# Patient Record
Sex: Female | Born: 1960 | Hispanic: Yes | Marital: Married | Smoking: Never smoker
Health system: Southern US, Community
[De-identification: ages and names within clinical notes are randomized; demographics above are authoritative.]

## PROBLEM LIST (undated history)

## (undated) DIAGNOSIS — D649 Anemia, unspecified: Secondary | ICD-10-CM

## (undated) DIAGNOSIS — N289 Disorder of kidney and ureter, unspecified: Secondary | ICD-10-CM

## (undated) DIAGNOSIS — I10 Essential (primary) hypertension: Secondary | ICD-10-CM

## (undated) HISTORY — PX: CHOLECYSTECTOMY: SHX55

---

## 2003-06-28 HISTORY — PX: CHOLECYSTECTOMY: SHX55

## 2006-03-01 ENCOUNTER — Ambulatory Visit: Payer: Self-pay

## 2007-05-23 ENCOUNTER — Ambulatory Visit: Payer: Self-pay | Admitting: Family Medicine

## 2009-06-09 ENCOUNTER — Ambulatory Visit: Payer: Self-pay | Admitting: Family Medicine

## 2013-08-05 ENCOUNTER — Ambulatory Visit: Payer: Self-pay | Admitting: Internal Medicine

## 2013-08-26 ENCOUNTER — Ambulatory Visit: Payer: Self-pay | Admitting: Internal Medicine

## 2014-04-11 ENCOUNTER — Ambulatory Visit: Payer: Self-pay | Admitting: Internal Medicine

## 2014-10-09 IMAGING — MG MM ADDITIONAL VIEWS AT NO CHARGE
1 series · 4 of 4 positions shown · non-contrast
Comparison: [DATE] [DATE], [DATE], [DATE] [DATE], [DATE], [DATE] [DATE], [DATE]

CLINICAL DATA: Callback from screening mammogram for possible mass
left breast

EXAM:
DIGITAL DIAGNOSTIC  LEFT MAMMOGRAM WITH CAD
ULTRASOUND LEFT BREAST

[L ML · left · 4 of 4 slices shown]
[im 1/4]
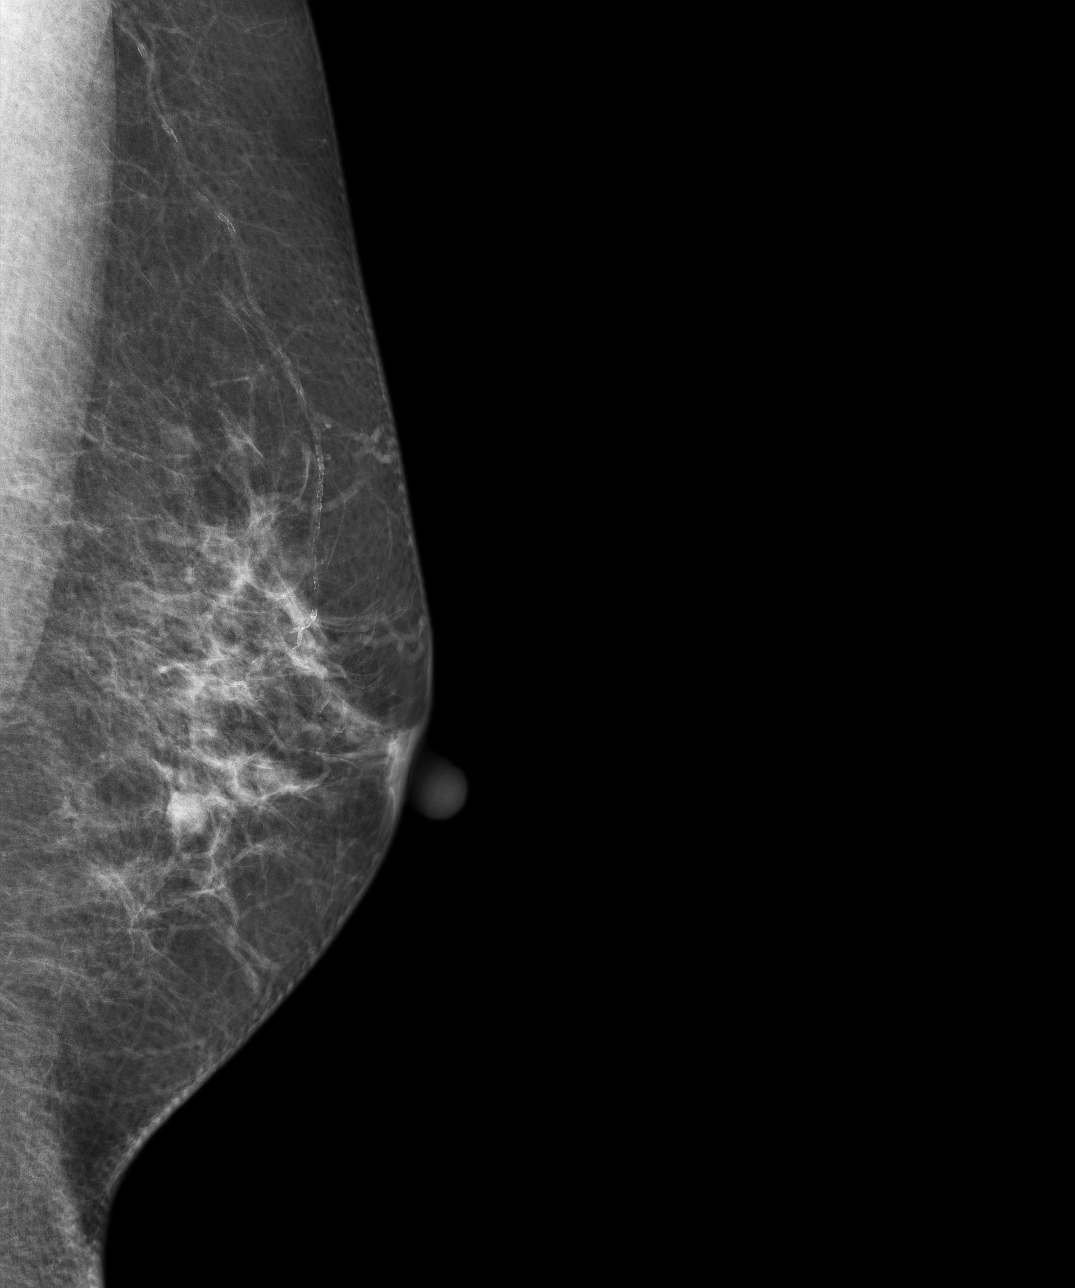
[im 2/4]
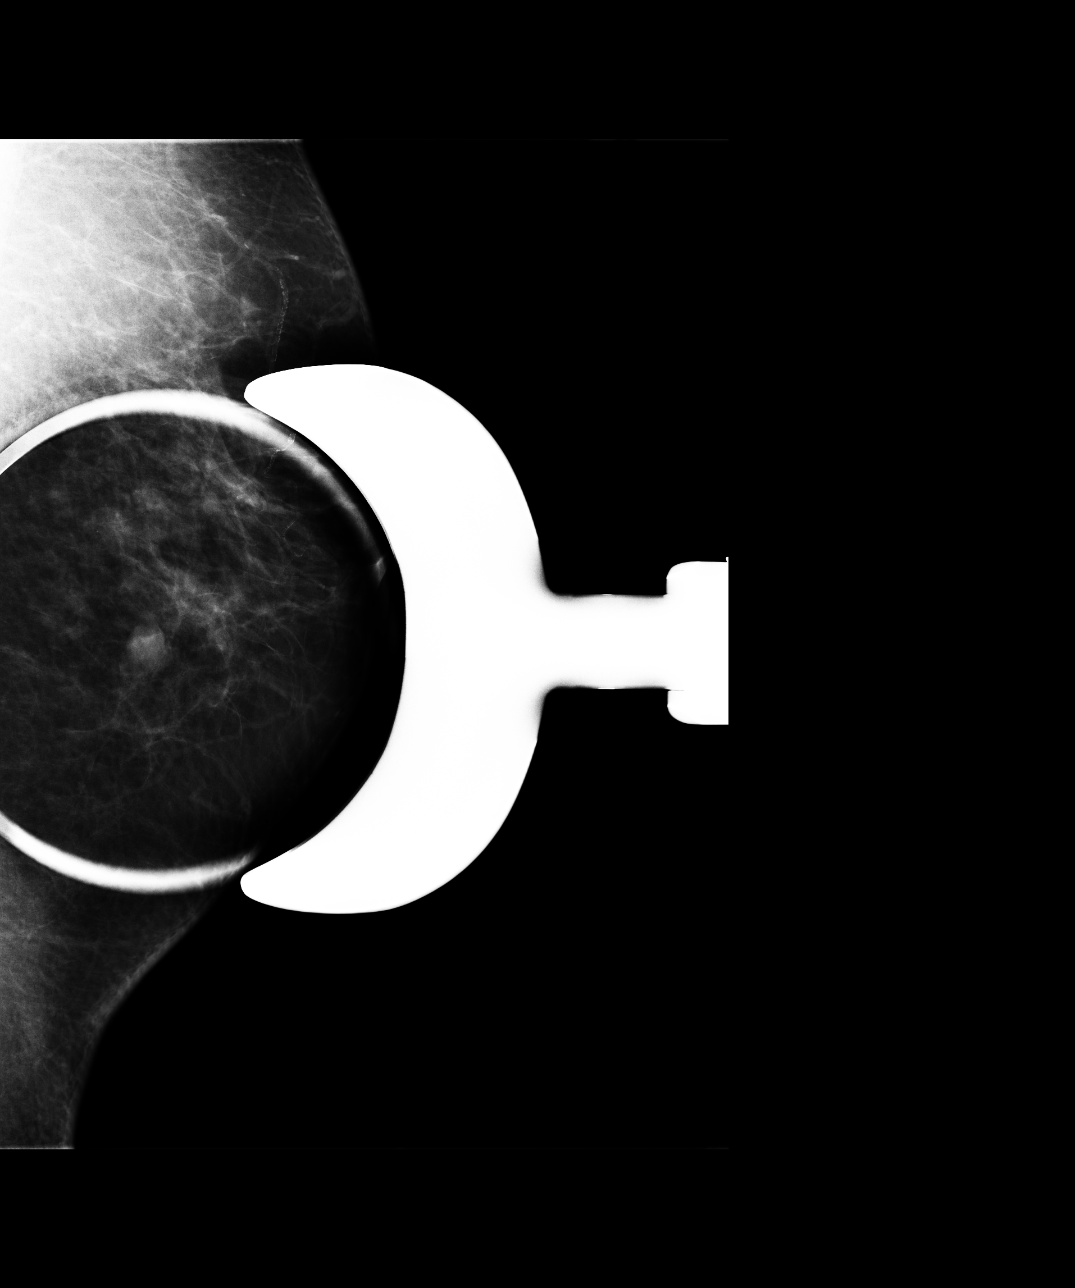
[im 3/4]
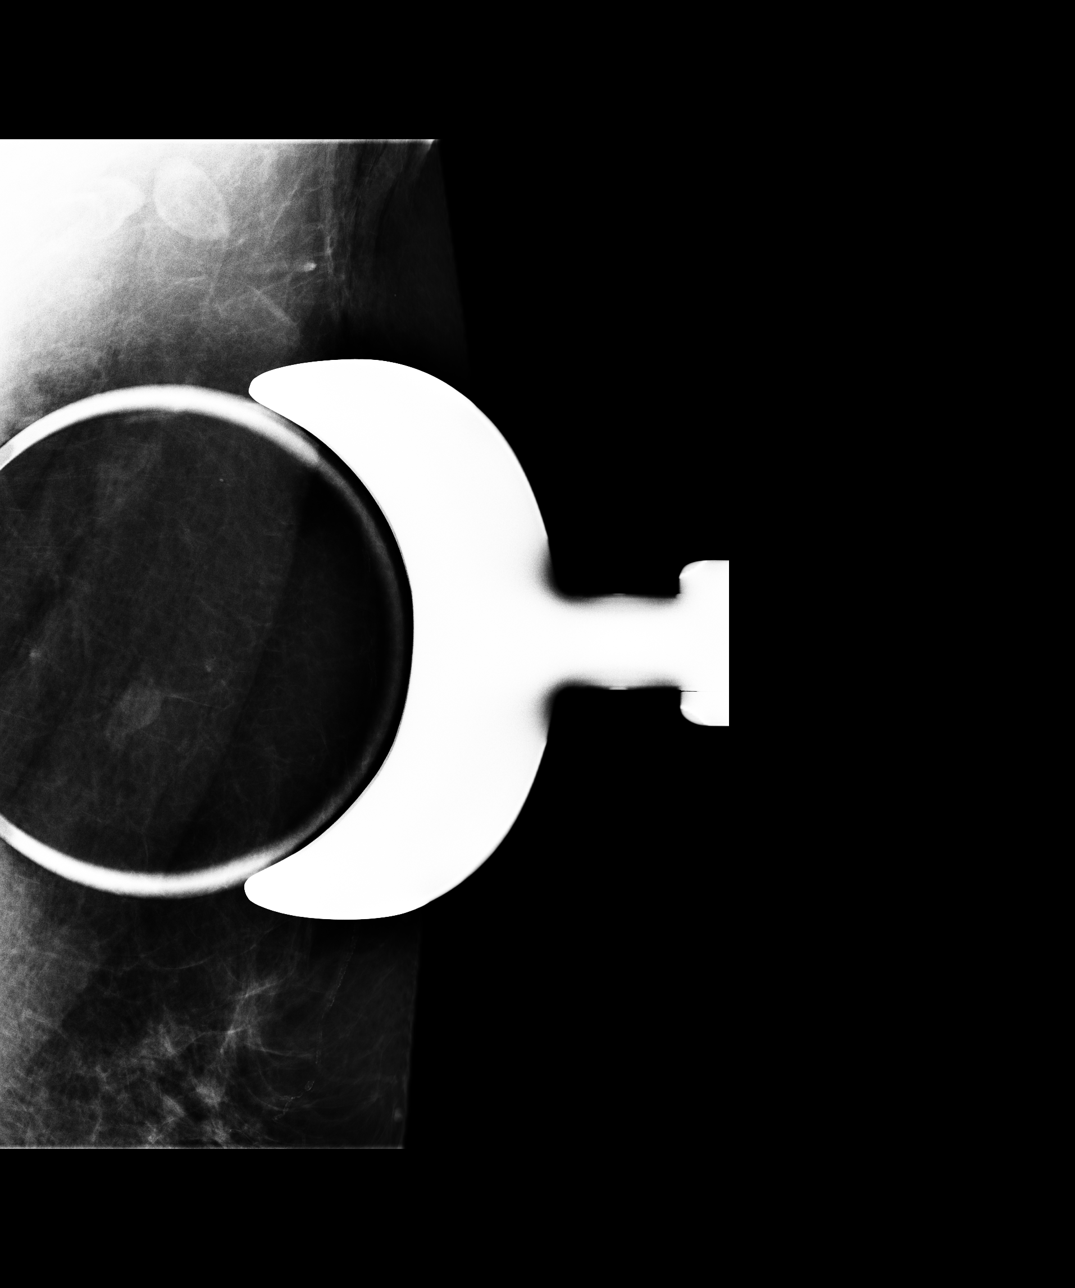
[im 4/4]
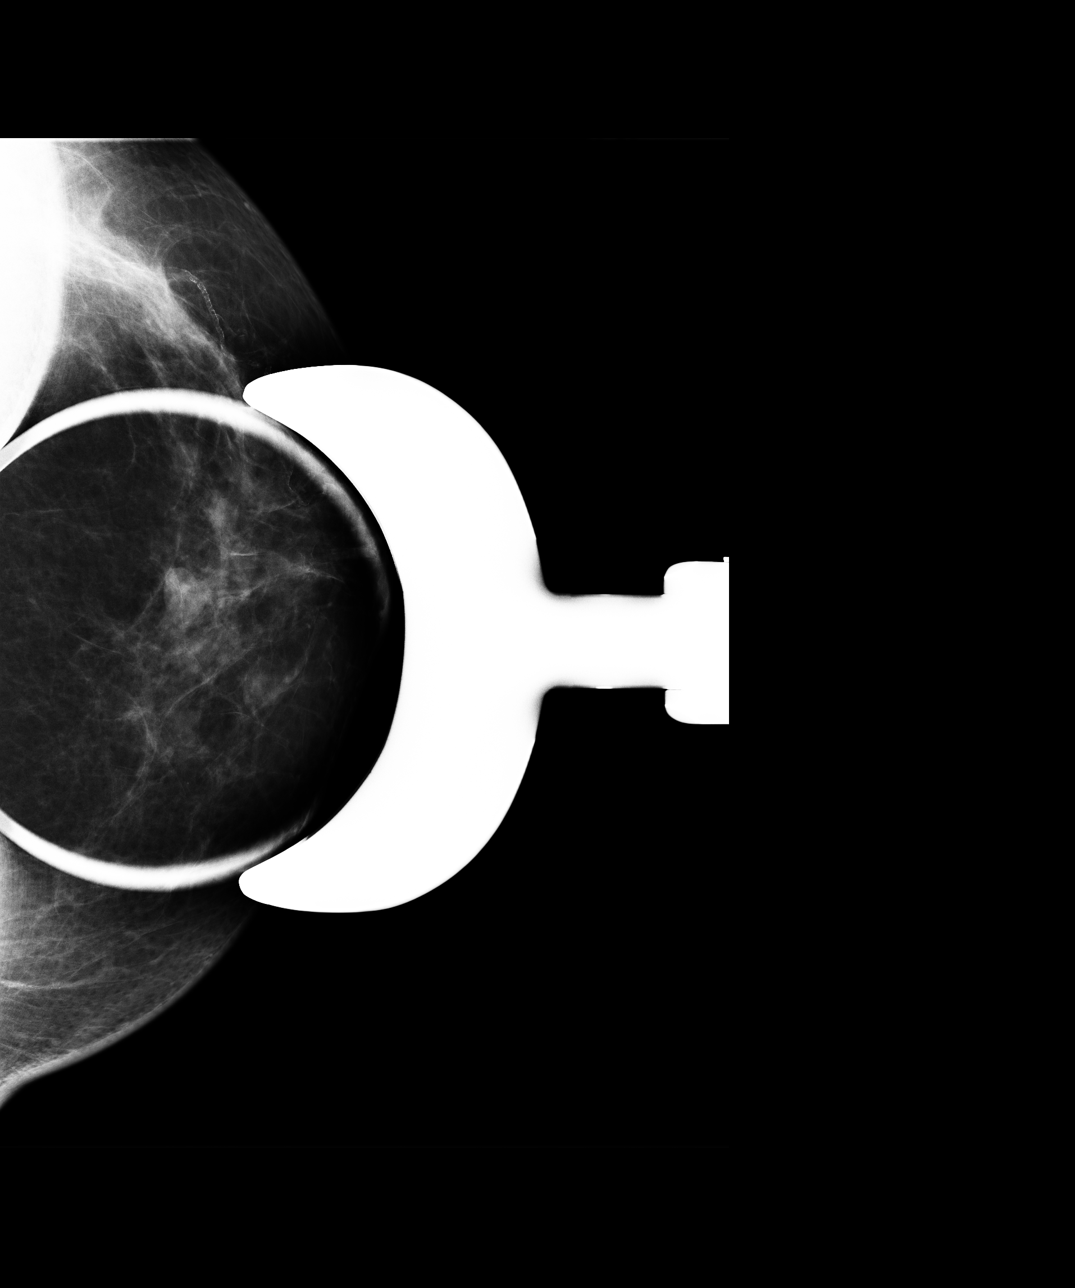

[4 of 4 positions shown; findings below may reference images not displayed]

ACR Breast Density Category b: There are scattered areas of
fibroglandular density.
FINDINGS: Lateral view of left breast, spot compression left CC and MLO views
are submitted. The previously questioned asymmetry in the
lower-inner quadrant left breast persists. The previously questioned
asymmetry in the posterior upper left breast has focal notched fat
and is unchanged from prior exam, consistent with intramammary lymph
node.

Mammographic images were processed with CAD

Ultrasound is performed, showing a 0.62 x 0.36 x 0.77 oval
hypoechoic lesion at the left breast 7:30 o'clock position
subareolar region correlating to the mammographic finding. This
could be a fibroadenoma.
IMPRESSION: Probable benign findings.

RECOMMENDATION:
Six month followup mammogram and ultrasound of left breast.

I have discussed the findings and recommendations with the patient.
Results were also provided in writing at the conclusion of the
visit. If applicable, a reminder letter will be sent to the patient
regarding the next appointment.

BI-RADS CATEGORY  3: Probably benign finding(s) - short interval
follow-up suggested.

## 2014-10-09 IMAGING — US US BREAST*L* LIMITED INC AXILLA
1 series · 4 of 4 positions shown · non-contrast
Comparison: [DATE] [DATE], [DATE], [DATE] [DATE], [DATE], [DATE] [DATE], [DATE]

CLINICAL DATA: Callback from screening mammogram for possible mass
left breast

EXAM:
DIGITAL DIAGNOSTIC  LEFT MAMMOGRAM WITH CAD
ULTRASOUND LEFT BREAST

[Series 1: us breast*left* limited inc axilla · 0.08mm/px · 4 of 4 slices shown]
[im 1/4]
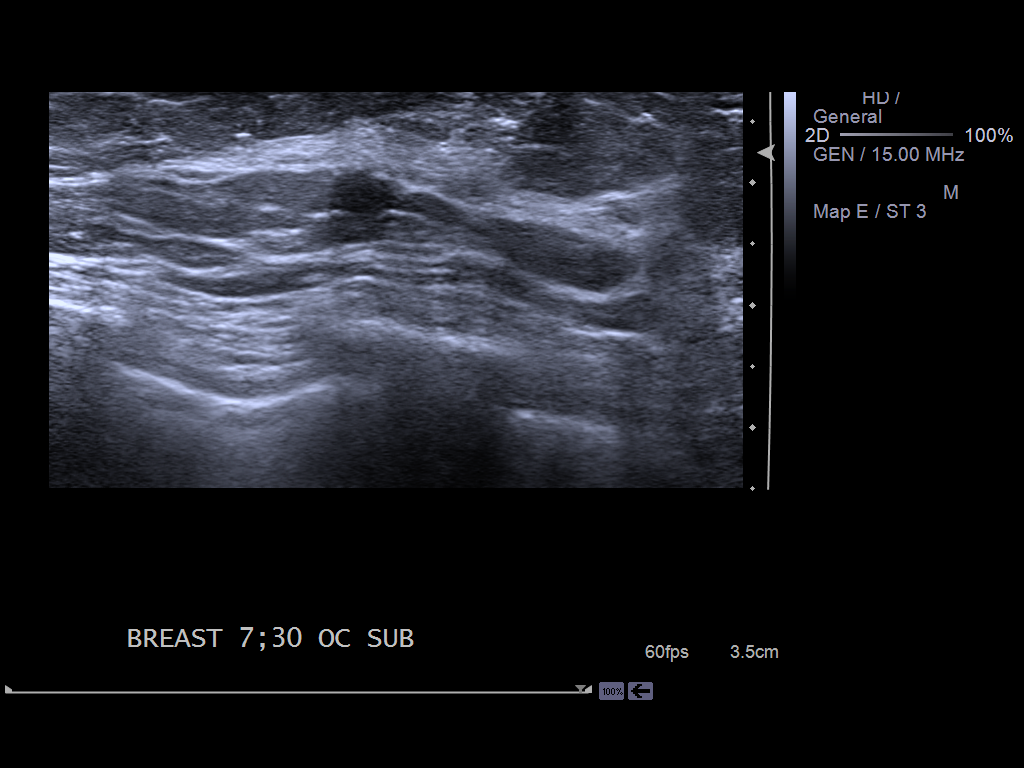
[im 2/4]
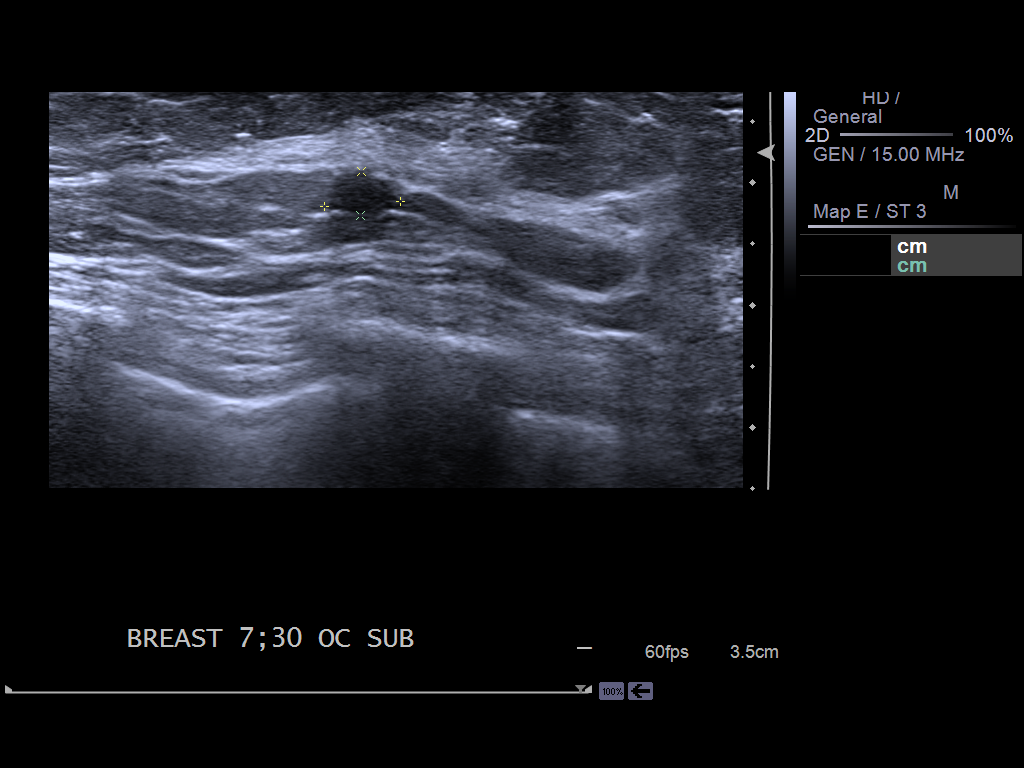
[im 3/4]
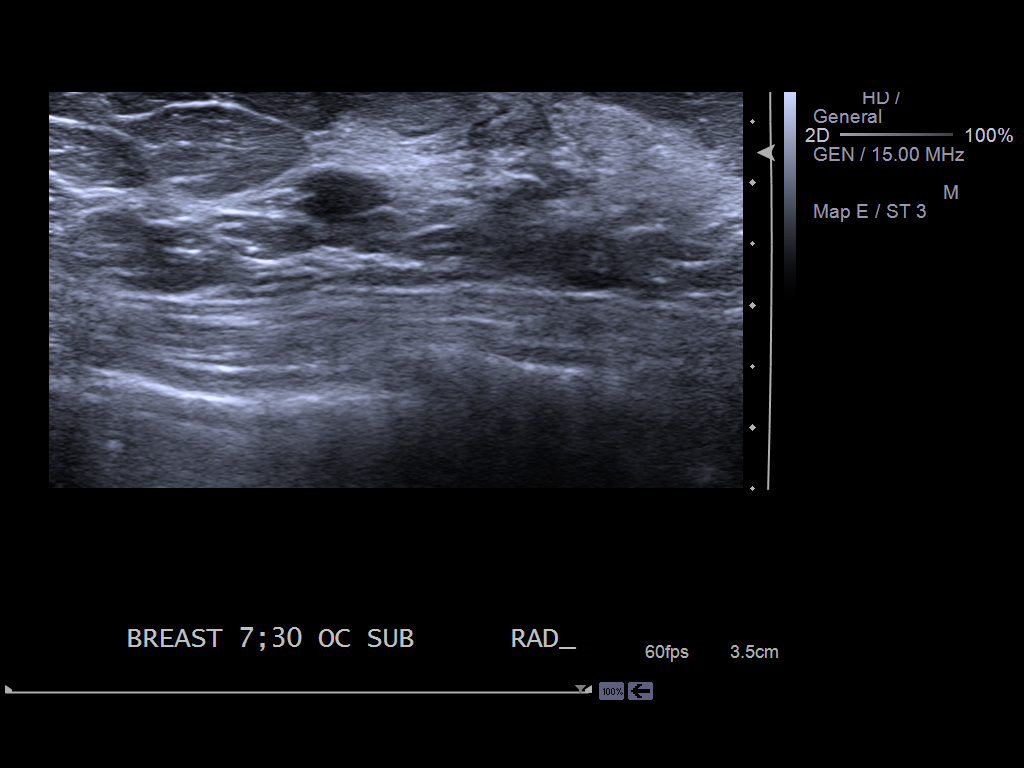
[im 4/4]
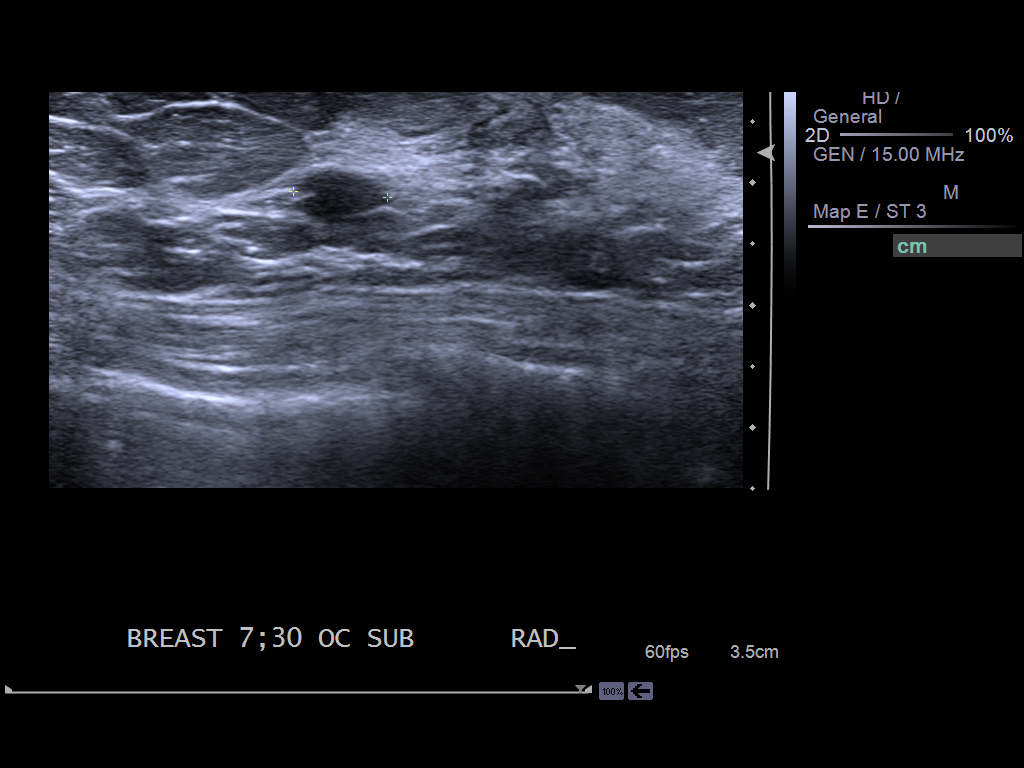

[4 of 4 positions shown; findings below may reference images not displayed]

ACR Breast Density Category b: There are scattered areas of
fibroglandular density.
FINDINGS: Lateral view of left breast, spot compression left CC and MLO views
are submitted. The previously questioned asymmetry in the
lower-inner quadrant left breast persists. The previously questioned
asymmetry in the posterior upper left breast has focal notched fat
and is unchanged from prior exam, consistent with intramammary lymph
node.

Mammographic images were processed with CAD

Ultrasound is performed, showing a 0.62 x 0.36 x 0.77 oval
hypoechoic lesion at the left breast 7:30 o'clock position
subareolar region correlating to the mammographic finding. This
could be a fibroadenoma.
IMPRESSION: Probable benign findings.

RECOMMENDATION:
Six month followup mammogram and ultrasound of left breast.

I have discussed the findings and recommendations with the patient.
Results were also provided in writing at the conclusion of the
visit. If applicable, a reminder letter will be sent to the patient
regarding the next appointment.

BI-RADS CATEGORY  3: Probably benign finding(s) - short interval
follow-up suggested.

## 2015-01-22 ENCOUNTER — Other Ambulatory Visit: Payer: Self-pay | Admitting: Family Medicine

## 2015-01-22 DIAGNOSIS — R1011 Right upper quadrant pain: Secondary | ICD-10-CM

## 2015-01-27 ENCOUNTER — Ambulatory Visit: Admission: RE | Admit: 2015-01-27 | Payer: Self-pay | Source: Ambulatory Visit

## 2015-10-23 ENCOUNTER — Other Ambulatory Visit: Payer: Self-pay | Admitting: Obstetrics and Gynecology

## 2015-10-23 DIAGNOSIS — Z1231 Encounter for screening mammogram for malignant neoplasm of breast: Secondary | ICD-10-CM

## 2015-10-26 ENCOUNTER — Ambulatory Visit: Payer: Self-pay | Attending: Obstetrics and Gynecology

## 2016-10-20 ENCOUNTER — Other Ambulatory Visit: Payer: Self-pay | Admitting: Obstetrics and Gynecology

## 2016-10-20 DIAGNOSIS — Z1231 Encounter for screening mammogram for malignant neoplasm of breast: Secondary | ICD-10-CM

## 2016-11-22 ENCOUNTER — Encounter (HOSPITAL_COMMUNITY): Payer: Self-pay

## 2016-11-22 ENCOUNTER — Ambulatory Visit
Admission: RE | Admit: 2016-11-22 | Discharge: 2016-11-22 | Disposition: A | Discharge status: Home / Self Care | Payer: BLUE CROSS/BLUE SHIELD | Source: Ambulatory Visit | Attending: Obstetrics and Gynecology | Admitting: Obstetrics and Gynecology

## 2016-11-22 DIAGNOSIS — Z1231 Encounter for screening mammogram for malignant neoplasm of breast: Secondary | ICD-10-CM

## 2017-10-24 ENCOUNTER — Other Ambulatory Visit: Payer: Self-pay | Admitting: Obstetrics and Gynecology

## 2017-10-24 DIAGNOSIS — Z1231 Encounter for screening mammogram for malignant neoplasm of breast: Secondary | ICD-10-CM

## 2017-11-24 ENCOUNTER — Ambulatory Visit
Admission: RE | Admit: 2017-11-24 | Discharge: 2017-11-24 | Disposition: A | Discharge status: Home / Self Care | Payer: BLUE CROSS/BLUE SHIELD | Source: Ambulatory Visit | Attending: Obstetrics and Gynecology | Admitting: Obstetrics and Gynecology

## 2017-11-24 DIAGNOSIS — Z1231 Encounter for screening mammogram for malignant neoplasm of breast: Secondary | ICD-10-CM

## 2018-03-24 ENCOUNTER — Other Ambulatory Visit: Payer: Self-pay

## 2018-03-24 ENCOUNTER — Encounter: Payer: Self-pay | Admitting: Emergency Medicine

## 2018-03-24 ENCOUNTER — Emergency Department
Admission: EM | Admit: 2018-03-24 | Discharge: 2018-03-24 | Disposition: A | Discharge status: Home / Self Care | Payer: BLUE CROSS/BLUE SHIELD | Source: Home / Self Care | Attending: Emergency Medicine | Admitting: Emergency Medicine

## 2018-03-24 DIAGNOSIS — R112 Nausea with vomiting, unspecified: Secondary | ICD-10-CM | POA: Insufficient documentation

## 2018-03-24 DIAGNOSIS — Z79899 Other long term (current) drug therapy: Secondary | ICD-10-CM

## 2018-03-24 DIAGNOSIS — K56609 Unspecified intestinal obstruction, unspecified as to partial versus complete obstruction: Secondary | ICD-10-CM | POA: Diagnosis not present

## 2018-03-24 DIAGNOSIS — R1012 Left upper quadrant pain: Secondary | ICD-10-CM

## 2018-03-24 DIAGNOSIS — R109 Unspecified abdominal pain: Secondary | ICD-10-CM

## 2018-03-24 DIAGNOSIS — D649 Anemia, unspecified: Secondary | ICD-10-CM | POA: Diagnosis present

## 2018-03-24 LAB — CBC
HEMATOCRIT: 29 % — AB (ref 35.0–47.0)
HEMATOCRIT: 32.2 % — AB (ref 35.0–47.0)
HEMOGLOBIN: 9.9 g/dL — AB (ref 12.0–16.0)
HEMOGLOBIN: 10.4 g/dL — AB (ref 12.0–16.0)
MCH: 24.9 pg — ABNORMAL LOW (ref 26.0–34.0)
MCH: 23.3 pg — ABNORMAL LOW (ref 26.0–34.0)
MCHC: 34.1 g/dL (ref 32.0–36.0)
MCHC: 32.2 g/dL (ref 32.0–36.0)
MCV: 73.1 fL — AB (ref 80.0–100.0)
MCV: 72.3 fL — ABNORMAL LOW (ref 80.0–100.0)
Platelets: 372 10*3/uL (ref 150–440)
Platelets: 429 10*3/uL (ref 150–440)
RBC: 3.96 MIL/uL (ref 3.80–5.20)
RBC: 4.45 MIL/uL (ref 3.80–5.20)
RDW: 18.5 % — ABNORMAL HIGH (ref 11.5–14.5)
RDW: 18.8 % — ABNORMAL HIGH (ref 11.5–14.5)
WBC: 8.9 10*3/uL (ref 3.6–11.0)
WBC: 14.8 10*3/uL — AB (ref 3.6–11.0)

## 2018-03-24 LAB — COMPREHENSIVE METABOLIC PANEL
ALBUMIN: 4.5 g/dL (ref 3.5–5.0)
ALT: 15 U/L (ref 0–44)
ALT: 18 U/L (ref 0–44)
ANION GAP: 10 (ref 5–15)
ANION GAP: 10 (ref 5–15)
AST: 26 U/L (ref 15–41)
AST: 33 U/L (ref 15–41)
Albumin: 4.8 g/dL (ref 3.5–5.0)
Alkaline Phosphatase: 91 U/L (ref 38–126)
Alkaline Phosphatase: 104 U/L (ref 38–126)
BILIRUBIN TOTAL: 0.7 mg/dL (ref 0.3–1.2)
BILIRUBIN TOTAL: 0.8 mg/dL (ref 0.3–1.2)
BUN: 15 mg/dL (ref 6–20)
BUN: 22 mg/dL — ABNORMAL HIGH (ref 6–20)
CHLORIDE: 103 mmol/L (ref 98–111)
CO2: 25 mmol/L (ref 22–32)
CO2: 26 mmol/L (ref 22–32)
Calcium: 9.6 mg/dL (ref 8.9–10.3)
Calcium: 9.8 mg/dL (ref 8.9–10.3)
Chloride: 101 mmol/L (ref 98–111)
Creatinine, Ser: 0.8 mg/dL (ref 0.44–1.00)
Creatinine, Ser: 0.9 mg/dL (ref 0.44–1.00)
GFR calc Af Amer: >60 mL/min (ref >60)
GFR calc non Af Amer: >60 mL/min (ref >60)
GLUCOSE: 127 mg/dL — AB (ref 70–99)
Glucose, Bld: 160 mg/dL — ABNORMAL HIGH (ref 70–99)
POTASSIUM: 4.2 mmol/L (ref 3.5–5.1)
POTASSIUM: 4.1 mmol/L (ref 3.5–5.1)
SODIUM: 138 mmol/L (ref 135–145)
Sodium: 137 mmol/L (ref 135–145)
Total Protein: 8 g/dL (ref 6.5–8.1)
Total Protein: 8.1 g/dL (ref 6.5–8.1)

## 2018-03-24 LAB — URINALYSIS, COMPLETE (UACMP) WITH MICROSCOPIC
BACTERIA UA: NONE SEEN
BILIRUBIN URINE: NEGATIVE
Glucose, UA: NEGATIVE mg/dL
HGB URINE DIPSTICK: NEGATIVE
Ketones, ur: NEGATIVE mg/dL
LEUKOCYTES UA: NEGATIVE
Nitrite: NEGATIVE
PROTEIN: 30 mg/dL — AB
Specific Gravity, Urine: 1.023 (ref 1.005–1.030)
Squamous Epithelial / LPF: NONE SEEN (ref 0–5)
pH: 8 (ref 5.0–8.0)

## 2018-03-24 LAB — LIPASE, BLOOD
LIPASE: 38 U/L (ref 11–51)
Lipase: 40 U/L (ref 11–51)

## 2018-03-24 LAB — TROPONIN I: Troponin I: <0.03 ng/mL (ref <0.03)

## 2018-03-24 MED ORDER — DICYCLOMINE HCL 20 MG PO TABS: 20.0000 mg | ORAL_TABLET | Freq: Three times a day (TID) | ORAL | 0 refills | Status: DC | PRN

## 2018-03-24 MED ORDER — DICYCLOMINE HCL 10 MG PO CAPS
20.0000 mg | ORAL_CAPSULE | Freq: Once | ORAL | Status: AC
  Administered 2018-03-24: 20 mg via ORAL
  Filled 2018-03-24: qty 2

## 2018-03-24 MED ORDER — GI COCKTAIL ~~LOC~~
30.0000 mL | Freq: Once | ORAL | Status: AC
  Administered 2018-03-24: 30 mL via ORAL
  Filled 2018-03-24: qty 30

## 2018-03-24 MED ORDER — SUCRALFATE 1 G PO TABS: 1.0000 g | ORAL_TABLET | Freq: Four times a day (QID) | ORAL | 0 refills | Status: DC

## 2018-03-24 MED ORDER — ONDANSETRON HCL 4 MG PO TABS: 4.0000 mg | ORAL_TABLET | Freq: Three times a day (TID) | ORAL | 0 refills | Status: DC | PRN

## 2018-03-24 NOTE — ED Notes (Signed)
Interpreter requested 

## 2018-03-24 NOTE — ED Notes (Signed)
Patient drank full cup of water. No c/o nausea at this time.

## 2018-03-24 NOTE — ED Provider Notes (Signed)
[facility] Emergency Department Provider Note   ____________________________________________   I have reviewed the triage vital signs and the nursing notes.   HISTORY  Chief Complaint Abdominal Pain   History limited by: [place] utilized   HPI Nicole Walsh is a 57 y.o. female who presents to the emergency department today because of concerns for abdominal pain.  The pain started last night.  Located in the left abdomen left upper quadrant.  The pain does radiate around to her back.  She has had some nausea and vomiting with this.  Vomiting was nonbloody.  Pain has been constant since it started.  She states that it reminds her of similar pain she had one year ago.  She states she went to [facility] at that time.  She denies any fevers.    Per medical record review patient has a history of ER visit to [facility] a little over one year ago. CT scan done at that time concerning for infectious enteritis.   History reviewed. No pertinent past medical history.  There are no active problems to display for this patient.   History reviewed. No pertinent surgical history.  Prior to Admission medications   Not on File    Allergies Patient has no allergy information on record.  Family History  Problem Relation Age of Onset  . Breast cancer Neg Hx     Social History Social History   Tobacco Use  . Smoking status: Not on file  Substance Use Topics  . Alcohol use: Not on file  . Drug use: Not on file    Review of Systems Constitutional: No fever/chills Eyes: No visual changes. ENT: No sore throat. Cardiovascular: Denies chest pain. Respiratory: Denies shortness of breath. Gastrointestinal: Positive for abdominal pain, nausea and vomiting.   Genitourinary: Negative for dysuria. Musculoskeletal: Negative for back pain. Skin: Negative for rash. Neurological: Negative for headaches, focal weakness or  numbness.  ____________________________________________   PHYSICAL EXAM:  VITAL SIGNS: ED Triage Vitals  Enc Vitals Group     BP 03/24/18 1019 133/77     Pulse Rate 03/24/18 1019 81     Resp 03/24/18 1019 18     Temp 03/24/18 1019 98.2 F (36.8 C)     Temp Source 03/24/18 1019 Oral     SpO2 03/24/18 1019 100 %     Weight 03/24/18 1021 160 lb (72.6 kg)     Height 03/24/18 1021 5\' 5"  (1.651 m)     Head Circumference --      Peak Flow --      Pain Score 03/24/18 1029 9    Constitutional: Alert and oriented.  Eyes: Conjunctivae are normal.  ENT      Head: Normocephalic and atraumatic.      Nose: No congestion/rhinnorhea.      Mouth/Throat: Mucous membranes are moist.      Neck: No stridor. Hematological/Lymphatic/Immunilogical: No cervical lymphadenopathy. Cardiovascular: Normal rate, regular rhythm.  No murmurs, rubs, or gallops.  Respiratory: Normal respiratory effort without tachypnea nor retractions. Breath sounds are clear and equal bilaterally. No wheezes/rales/rhonchi. Gastrointestinal: Soft and tender to palpation in the left abd and LUQ. Genitourinary: Deferred Musculoskeletal: Normal range of motion in all extremities. No lower extremity edema. Neurologic:  Normal speech and language. No gross focal neurologic deficits are appreciated.  Skin:  Skin is warm, dry and intact. No rash noted. Psychiatric: Mood and affect are normal. Speech and behavior are normal. Patient exhibits appropriate insight and  judgment.  ____________________________________________    LABS (pertinent positives/negatives)  Lipase 38 Trop <0.03 CBC wbc 8.9, hgb 9.9, plt 372 CMP wnl except glu 127  ____________________________________________   EKG  I, Nance Pear, attending physician, personally viewed and interpreted this EKG  EKG Time: 1035 Rate: 87 Rhythm: normal sinus rhythm Axis: normal Intervals: qtc 433 QRS: narrow ST changes: no st elevation Impression: normal  ekg   ____________________________________________    RADIOLOGY  None  ____________________________________________   PROCEDURES  Procedures  ____________________________________________   INITIAL IMPRESSION / ASSESSMENT AND PLAN / ED COURSE  Pertinent labs & imaging results that were available during my care of the patient were reviewed by me and considered in my medical decision making (see chart for details).   Patient presented to the emergency department today because of concerns for abdominal pain located on the left side.  Differential includes gastritis, gastric ulcer, diverticulitis, gastroenteritis amongst other etiologies.  Patient states his pain is similar to pain she had one year ago.  CT scan at that time showed possible infective enteritis.  Patient without any leukocytosis or fever here.  She did have good relief of pain with Bentyl.  At this point do not feel any emergent reimaging is necessary.  Will plan on discharge with medication.  ____________________________________________   FINAL CLINICAL IMPRESSION(S) / ED DIAGNOSES  Final diagnoses:  Abdominal pain, unspecified abdominal location     Note: This dictation was prepared with Dragon dictation. Any transcriptional errors that result from this process are unintentional     Nance Pear, MD 03/24/18 1401

## 2018-03-24 NOTE — Discharge Instructions (Addendum)
Please seek medical attention for any high fevers, chest pain, shortness of breath, change in behavior, persistent vomiting, bloody stool or any other new or concerning symptoms.  

## 2018-03-24 NOTE — ED Triage Notes (Signed)
Pt to triage via wheelchair. Pt reports pain to her abd from midline radiating around to her left flank region. Pt was seen here earlier today and given medications for the pain but states they are not helping. Pt asking for an xray of her abd. Pt denies urinary sx or diarrhea but reports + n/v.

## 2018-03-24 NOTE — ED Triage Notes (Signed)
Pt reports pain to the left side of her abdomen that radiates to her back. PT states he pain is intermittent and started a year ago. Pt reports the pain was worse today.

## 2018-03-24 NOTE — ED Notes (Signed)
Spoke with Dr Karma Greaser about pt and only wants to repeat blood work.

## 2018-03-25 ENCOUNTER — Encounter: Payer: Self-pay | Admitting: Radiology

## 2018-03-25 ENCOUNTER — Other Ambulatory Visit: Payer: Self-pay

## 2018-03-25 ENCOUNTER — Emergency Department: Payer: BLUE CROSS/BLUE SHIELD

## 2018-03-25 ENCOUNTER — Inpatient Hospital Stay
Admission: EM | Admit: 2018-03-25 | Discharge: 2018-03-28 | DRG: 390 | Disposition: A | Discharge status: Home / Self Care | Payer: BLUE CROSS/BLUE SHIELD | Attending: Surgery | Admitting: Surgery

## 2018-03-25 ENCOUNTER — Inpatient Hospital Stay: Payer: BLUE CROSS/BLUE SHIELD

## 2018-03-25 DIAGNOSIS — K56609 Unspecified intestinal obstruction, unspecified as to partial versus complete obstruction: Secondary | ICD-10-CM | POA: Diagnosis present

## 2018-03-25 DIAGNOSIS — Z79899 Other long term (current) drug therapy: Secondary | ICD-10-CM | POA: Diagnosis not present

## 2018-03-25 DIAGNOSIS — D649 Anemia, unspecified: Secondary | ICD-10-CM | POA: Diagnosis present

## 2018-03-25 DIAGNOSIS — Z978 Presence of other specified devices: Secondary | ICD-10-CM

## 2018-03-25 MED ORDER — FAMOTIDINE IN NACL 20-0.9 MG/50ML-% IV SOLN
20.0000 mg | Freq: Two times a day (BID) | INTRAVENOUS | Status: DC
  Administered 2018-03-25 – 2018-03-28 (×8): 20 mg via INTRAVENOUS
  Filled 2018-03-25 (×8): qty 50

## 2018-03-25 MED ORDER — ONDANSETRON HCL 4 MG/2ML IJ SOLN
4.0000 mg | Freq: Once | INTRAMUSCULAR | Status: AC
  Administered 2018-03-25: 4 mg via INTRAVENOUS
  Filled 2018-03-25: qty 2

## 2018-03-25 MED ORDER — ACETAMINOPHEN 325 MG PO TABS
650.0000 mg | ORAL_TABLET | Freq: Four times a day (QID) | ORAL | Status: DC | PRN
  Administered 2018-03-25: 650 mg via ORAL
  Filled 2018-03-25: qty 2

## 2018-03-25 MED ORDER — ONDANSETRON 4 MG PO TBDP: 4.0000 mg | ORAL_TABLET | Freq: Four times a day (QID) | ORAL | Status: DC | PRN

## 2018-03-25 MED ORDER — GI COCKTAIL ~~LOC~~
30.0000 mL | Freq: Once | ORAL | Status: AC
  Administered 2018-03-25: 30 mL via ORAL
  Filled 2018-03-25: qty 30

## 2018-03-25 MED ORDER — PHENOL 1.4 % MT LIQD
1.0000 | OROMUCOSAL | Status: DC | PRN
  Administered 2018-03-25 – 2018-03-26 (×2): 1 via OROMUCOSAL
  Filled 2018-03-25: qty 177

## 2018-03-25 MED ORDER — SODIUM CHLORIDE 0.9 % IV BOLUS
1000.0000 mL | Freq: Once | INTRAVENOUS | Status: AC
  Administered 2018-03-25: 1000 mL via INTRAVENOUS

## 2018-03-25 MED ORDER — ENOXAPARIN SODIUM 40 MG/0.4ML ~~LOC~~ SOLN
40.0000 mg | SUBCUTANEOUS | Status: DC
  Administered 2018-03-25 – 2018-03-28 (×4): 40 mg via SUBCUTANEOUS
  Filled 2018-03-25 (×4): qty 0.4

## 2018-03-25 MED ORDER — IOPAMIDOL (ISOVUE-300) INJECTION 61%
30.0000 mL | Freq: Once | INTRAVENOUS | Status: AC | PRN
  Administered 2018-03-25: 30 mL via ORAL

## 2018-03-25 MED ORDER — LACTATED RINGERS IV SOLN
INTRAVENOUS | Status: DC
  Administered 2018-03-25 – 2018-03-26 (×2): via INTRAVENOUS

## 2018-03-25 MED ORDER — TRAMADOL HCL 50 MG PO TABS: 50.0000 mg | ORAL_TABLET | Freq: Four times a day (QID) | ORAL | Status: DC | PRN

## 2018-03-25 MED ORDER — IOPAMIDOL (ISOVUE-300) INJECTION 61%
100.0000 mL | Freq: Once | INTRAVENOUS | Status: AC | PRN
  Administered 2018-03-25: 100 mL via INTRAVENOUS

## 2018-03-25 MED ORDER — IBUPROFEN 100 MG/5ML PO SUSP: 400.0000 mg | Freq: Once | ORAL | Status: DC

## 2018-03-25 MED ORDER — MORPHINE SULFATE (PF) 2 MG/ML IV SOLN
2.0000 mg | INTRAVENOUS | Status: DC | PRN
  Administered 2018-03-25: 2 mg via INTRAVENOUS
  Filled 2018-03-25: qty 1

## 2018-03-25 MED ORDER — DOCUSATE SODIUM 100 MG PO CAPS: 100.0000 mg | ORAL_CAPSULE | Freq: Two times a day (BID) | ORAL | Status: DC | PRN

## 2018-03-25 MED ORDER — ONDANSETRON HCL 4 MG/2ML IJ SOLN: 4.0000 mg | Freq: Four times a day (QID) | INTRAMUSCULAR | Status: DC | PRN

## 2018-03-25 MED ORDER — MORPHINE SULFATE (PF) 4 MG/ML IV SOLN
4.0000 mg | Freq: Once | INTRAVENOUS | Status: AC
  Administered 2018-03-25: 4 mg via INTRAVENOUS
  Filled 2018-03-25: qty 1

## 2018-03-25 NOTE — H&P (Signed)
Subjective:   CC: obstruction  HPI:  Nicole Walsh is a 57 y.o. female who was consulted by [facility] for issue above.  Symptoms were first noted 2 days ago. Pain is LLQ, sharp, confined to the area, without radiation.  Associated with nausea/ vomting, one episode of diarrhea prior to admission.   exacerbated by nothing specific     Past Medical History: none reported  Past Surgical History: lap chole  Family History: non-contributory  Social History: denies tobacco, ETOH  Current Medications: no blood thinners  Allergies:  Allergies as of 03/24/2018  . (No Known Allergies)    ROS:  A 15 point review of systems was performed and pertinent positives and negatives noted in HPI   Objective:     BP 124/76 (BP Location: Left Arm)   Pulse (!) 110   Temp 98.8 F (37.1 C) (Oral)   Resp 18   Ht 5\' 5"  (1.651 m)   Wt 72.6 kg   SpO2 98%   BMI 26.63 kg/m   Constitutional :  alert, cooperative, appears stated age and no distress  Lymphatics/Throat:  no asymmetry, masses, or scars  Respiratory:  clear to auscultation bilaterally  Cardiovascular:  regular rate and rhythm  Gastrointestinal: minimally tender LLQ, suprapubic, soft, no guarding.   Musculoskeletal: Steady gait and movement  Skin: Cool and moist, lap chole surgical scars   Psychiatric: Normal affect, non-agitated, not confused       LABS:  CMP Latest Ref Rng & Units 03/24/2018 03/24/2018  Glucose 70 - 99 mg/dL 160(H) 127(H)  BUN 6 - 20 mg/dL 22(H) 15  Creatinine 0.44 - 1.00 mg/dL 0.90 0.80  Sodium 135 - 145 mmol/L 137 138  Potassium 3.5 - 5.1 mmol/L 4.1 4.2  Chloride 98 - 111 mmol/L 101 103  CO2 22 - 32 mmol/L 26 25  Calcium 8.9 - 10.3 mg/dL 9.8 9.6  Total Protein 6.5 - 8.1 g/dL 8.1 8.0  Total Bilirubin 0.3 - 1.2 mg/dL 0.8 0.7  Alkaline Phos 38 - 126 U/L 104 91  AST 15 - 41 U/L 33 26  ALT 0 - 44 U/L 18 15   CBC Latest Ref Rng & Units 03/24/2018 03/24/2018  WBC 3.6 - 11.0 K/uL 14.8(H) 8.9  Hemoglobin 12.0 -  16.0 g/dL 10.4(L) 9.9(L)  Hematocrit 35.0 - 47.0 % 32.2(L) 29.0(L)  Platelets 150 - 440 K/uL 429 372    RADS: CLINICAL DATA:  57 year old female with left-sided abdominal pain.  EXAM: CT ABDOMEN AND PELVIS WITH CONTRAST  TECHNIQUE: Multidetector CT imaging of the abdomen and pelvis was performed using the standard protocol following bolus administration of intravenous contrast.  CONTRAST:  160mL ISOVUE-300 IOPAMIDOL (ISOVUE-300) INJECTION 61%, 32mL ISOVUE-300 IOPAMIDOL (ISOVUE-300) INJECTION 61%  COMPARISON:  None.  FINDINGS: Lower chest: The visualized lung bases are clear.  No intra-abdominal free air.  Small free fluid within the pelvis.  Hepatobiliary: Subcentimeter right hepatic hypodense lesion is too small to characterize. The liver is otherwise unremarkable. Mild intrahepatic biliary ductal dilatation, likely post cholecystectomy.  Pancreas: Unremarkable. No pancreatic ductal dilatation or surrounding inflammatory changes.  Spleen: Normal in size without focal abnormality.  Adrenals/Urinary Tract: Adrenal glands are unremarkable. Kidneys are normal, without renal calculi, focal lesion, or hydronephrosis. Bladder is unremarkable.  Stomach/Bowel: Oral contrast opacifies the stomach and multiple loops of small bowel. There is dilatation of loops of small bowel measuring up to 3.5 cm. The terminal ileum is collapsed. The transition is noted in the right anterior hemipelvis (series 2, image  70 and coronal series 5, image 33). There scattered colonic diverticula without active inflammatory changes. The appendix is normal.  Vascular/Lymphatic: No significant vascular findings are present. No enlarged abdominal or pelvic lymph nodes.  Reproductive: The uterus is anteverted and grossly unremarkable. No adnexal masses.  Other: None  Musculoskeletal: Osteopenia. Disc desiccation at L5-S1. No acute osseous pathology.  IMPRESSION: 1. Small-bowel  obstruction with transition in the right anterior pelvis. 2. Colonic diverticulosis.   Electronically Signed   By: Anner Crete M.D.   On: 03/25/2018 02:01 Assessment:   SBO  Plan:     Discussed pathophisiology and treatment options including NG tube decompression and possible surgery for lysis of adhesions, laparoscopic or open.  The risk of surgery include, but not limited to, recurrence, bleeding, chronic pain, post-op infxn, post-op SBO or ileus, hernias, resection of bowel, re-anastamosis, possible ostomy placement and need for re-operation to address said risks. The risks of general anesthetic, if used, includes MI, CVA, sudden death or even reaction to anesthetic medications also discussed. Alternatives include continued observation and NG decompression.  Benefits include possible symptom relief, preventing further decline in health and possible death.  Typical post-op recovery time of additional days in hospital for observation afterwards also discussed.  The patient verbalized understanding and all questions were answered to the patient's satisfaction.  Will place NG and admit to floor.

## 2018-03-25 NOTE — Progress Notes (Addendum)
Pt reconnected to intermittent wall suction via NG tube. Pt in no distress, bed in semi-fowlers at 35 degrees. VS WDL, denies pain, family at bedside.

## 2018-03-25 NOTE — Progress Notes (Signed)
IS education complete (translated by family member). Pt understands technique and reason for use. Pt inspire 2081ml+. Pt independent with use.

## 2018-03-25 NOTE — ED Provider Notes (Signed)
[facility] Emergency Department Provider Note  Time seen: 12:25 AM  I have reviewed the triage vital signs and the nursing notes. Spanish interpreter used for this evaluation via iPad  HISTORY  Chief Complaint Abdominal Pain    HPI Nicole Walsh is a 57 y.o. female with no significant past medical history who presents to the emergency department for left-sided abdominal pain.  According to the patient since yesterday night she has been expensing left-sided abdominal pain described as moderate burning type pain radiating from her left upper abdomen to her left mid back.  Denies any dysuria or hematuria.  Denies diarrhea black or bloody stool.  Patient was seen in the emergency department yesterday for the same pain had a negative work-up and was discharged home.  She states since going home the pain is worsened and she has begun vomiting so she returned to the emergency department.  Denies any fever. History reviewed. No pertinent past medical history.  There are no active problems to display for this patient.   History reviewed. No pertinent surgical history.  Prior to Admission medications   Medication Sig Start Date End Date Taking? Authorizing Provider  dicyclomine (BENTYL) 20 MG tablet Take 1 tablet (20 mg total) by mouth 3 (three) times daily as needed (abdominal pain). 03/24/18   Nance Pear, MD  ondansetron (ZOFRAN) 4 MG tablet Take 1 tablet (4 mg total) by mouth every 8 (eight) hours as needed for nausea or vomiting. 03/24/18   Nance Pear, MD  sucralfate (CARAFATE) 1 g tablet Take 1 tablet (1 g total) by mouth 4 (four) times daily. 03/24/18   Nance Pear, MD    No Known Allergies  Family History  Problem Relation Age of Onset  . Breast cancer Neg Hx     Social History Social History   Tobacco Use  . Smoking status: Not on file  Substance Use Topics  . Alcohol use: Not on file  . Drug use: Not on file    Review of  Systems Constitutional: Negative for fever Cardiovascular: Negative for chest pain. Respiratory: Negative for shortness of breath. Gastrointestinal: Left-sided abdominal pain/burning.  Positive for nausea vomiting.  Negative for diarrhea Genitourinary: Negative for urinary compaints Musculoskeletal: Negative for musculoskeletal complaints Skin: Negative for skin complaints  Neurological: Negative for headache All other ROS negative  ____________________________________________   PHYSICAL EXAM:  VITAL SIGNS: ED Triage Vitals  Enc Vitals Group     BP 03/24/18 2245 124/76     Pulse Rate 03/24/18 2245 (!) 110     Resp 03/24/18 2245 18     Temp 03/24/18 2245 98.8 F (37.1 C)     Temp Source 03/24/18 2245 Oral     SpO2 03/24/18 2245 98 %     Weight 03/24/18 2247 160 lb 0.9 oz (72.6 kg)     Height 03/24/18 2247 5\' 5"  (1.651 m)     Head Circumference --      Peak Flow --      Pain Score 03/24/18 2246 8     Pain Loc --      Pain Edu? --      Excl. in [place]? --     Constitutional: Alert and oriented. Well appearing and in no distress. Eyes: Normal exam ENT   Head: Normocephalic and atraumatic.   Mouth/Throat: Mucous membranes are moist. Cardiovascular: Normal rate, regular rhythm. No murmur Respiratory: Normal respiratory effort without tachypnea nor retractions. Breath sounds are clear  Gastrointestinal: Soft and nontender.  No distention.  Musculoskeletal: Nontender with normal range of motion in all extremities.  Neurologic:  Normal speech and language. No gross focal neurologic deficits Skin:  Skin is warm, dry and intact.  Psychiatric: Mood and affect are normal.   ____________________________________________    RADIOLOGY  CT consistent with small bowel obstruction  ____________________________________________   INITIAL IMPRESSION / ASSESSMENT AND PLAN / ED COURSE  Pertinent labs & imaging results that were available during my care of the patient were  reviewed by me and considered in my medical decision making (see chart for details).  Patient presents to the emergency department for left-sided abdominal pain for the past 2 days was seen here yesterday with a largely negative work-up and discharged home.  States the pain is worsened since going home and she is now vomiting so she returned to the emergency department.  Differential would include colitis, diverticulitis, pyelonephritis.  I reviewed the patient's work-up from yesterday which was largely nonrevealing, her repeat lab work today shows a moderate leukocytosis.  Remains afebrile but mildly tachycardic.  We will treat pain, nausea, IV hydrate and obtain CT imaging of the abdomen/pelvis to further evaluate.  Patient agreeable to plan of care.  CT consistent with small bowel obstruction.  Discussed with general surgery will be admitting the patient to their service.  ____________________________________________   FINAL CLINICAL IMPRESSION(S) / ED DIAGNOSES  Left abdominal pain Small bowel obstruction   Harvest Dark, MD 03/25/18 [phone]

## 2018-03-26 ENCOUNTER — Encounter: Payer: Self-pay | Admitting: Nurse Practitioner

## 2018-03-26 LAB — MAGNESIUM: Magnesium: 2.1 mg/dL (ref 1.7–2.4)

## 2018-03-26 LAB — CBC
HCT: 27.1 % — ABNORMAL LOW (ref 35.0–47.0)
HEMOGLOBIN: 8.8 g/dL — AB (ref 12.0–16.0)
MCH: 24.1 pg — ABNORMAL LOW (ref 26.0–34.0)
MCHC: 32.4 g/dL (ref 32.0–36.0)
MCV: 74.5 fL — AB (ref 80.0–100.0)
Platelets: 287 10*3/uL (ref 150–440)
RBC: 3.64 MIL/uL — ABNORMAL LOW (ref 3.80–5.20)
RDW: 18.9 % — ABNORMAL HIGH (ref 11.5–14.5)
WBC: 4.6 10*3/uL (ref 3.6–11.0)

## 2018-03-26 LAB — BASIC METABOLIC PANEL
Anion gap: 6 (ref 5–15)
BUN: 13 mg/dL (ref 6–20)
CO2: 27 mmol/L (ref 22–32)
CREATININE: 0.69 mg/dL (ref 0.44–1.00)
Calcium: 8.5 mg/dL — ABNORMAL LOW (ref 8.9–10.3)
Chloride: 106 mmol/L (ref 98–111)
GFR calc Af Amer: >60 mL/min (ref >60)
GFR calc non Af Amer: >60 mL/min (ref >60)
GLUCOSE: 91 mg/dL (ref 70–99)
POTASSIUM: 3.8 mmol/L (ref 3.5–5.1)
Sodium: 139 mmol/L (ref 135–145)

## 2018-03-26 LAB — PHOSPHORUS: Phosphorus: 3 mg/dL (ref 2.5–4.6)

## 2018-03-26 LAB — HEMOGLOBIN AND HEMATOCRIT, BLOOD
HCT: 27.8 % — ABNORMAL LOW (ref 35.0–47.0)
Hemoglobin: 9 g/dL — ABNORMAL LOW (ref 12.0–16.0)

## 2018-03-26 MED ORDER — KCL IN DEXTROSE-NACL 40-5-0.45 MEQ/L-%-% IV SOLN
INTRAVENOUS | Status: DC
  Administered 2018-03-26: 09:00:00 via INTRAVENOUS
  Filled 2018-03-26 (×6): qty 1000

## 2018-03-26 NOTE — Progress Notes (Signed)
Subjective:  CC:  Nicole Walsh is a 57 y.o. female  Hospital stay day 1,   SBO  HPI: No issues overnight.  Not getting out of bed.  No flatus or BM.  History obtained via interpreter.  ROS:  A 5 point review of systems was performed and pertinent positives and negatives noted in HPI.   Objective:      Temp:  [98.8 F (37.1 C)-99.5 F (37.5 C)] 98.8 F (37.1 C) (09/30 0443) Pulse Rate:  [80-93] 80 (09/30 0443) Resp:  [18-20] 18 (09/30 0443) BP: (118-135)/(65-81) 135/81 (09/30 0443) SpO2:  [95 %-97 %] 96 % (09/30 0443)     Height: 5\' 5"  (165.1 cm) Weight: 72.6 kg BMI (Calculated): 26.63   Intake/Output this shift:  NG 300 of bilious output       Constitutional :  alert, cooperative, appears stated age and no distress  Respiratory:  clear to auscultation bilaterally  Cardiovascular:  regular rate and rhythm  Gastrointestinal: soft, no guarding, focal pain in LLQ, unchanged..   Skin: Cool and moist.   Psychiatric: Normal affect, non-agitated, not confused       LABS:  CMP Latest Ref Rng & Units 03/26/2018 03/24/2018 03/24/2018  Glucose 70 - 99 mg/dL 91 160(H) 127(H)  BUN 6 - 20 mg/dL 13 22(H) 15  Creatinine 0.44 - 1.00 mg/dL 0.69 0.90 0.80  Sodium 135 - 145 mmol/L 139 137 138  Potassium 3.5 - 5.1 mmol/L 3.8 4.1 4.2  Chloride 98 - 111 mmol/L 106 101 103  CO2 22 - 32 mmol/L 27 26 25   Calcium 8.9 - 10.3 mg/dL 8.5(L) 9.8 9.6  Total Protein 6.5 - 8.1 g/dL - 8.1 8.0  Total Bilirubin 0.3 - 1.2 mg/dL - 0.8 0.7  Alkaline Phos 38 - 126 U/L - 104 91  AST 15 - 41 U/L - 33 26  ALT 0 - 44 U/L - 18 15   CBC Latest Ref Rng & Units 03/26/2018 03/24/2018 03/24/2018  WBC 3.6 - 11.0 K/uL 4.6 14.8(H) 8.9  Hemoglobin 12.0 - 16.0 g/dL 8.8(L) 10.4(L) 9.9(L)  Hematocrit 35.0 - 47.0 % 27.1(L) 32.2(L) 29.0(L)  Platelets 150 - 440 K/uL 287 429 372    RADS: N/A Assessment:   SBO- cont NG decompression.  Gum/candy, ambulate halls. Will continue to monitor  Anemia, unknown etiology-  possible dilutional.  Will repeat in pm to monitor. No evidence of active bleeding now.

## 2018-03-27 LAB — BASIC METABOLIC PANEL
Anion gap: 6 (ref 5–15)
BUN: 10 mg/dL (ref 6–20)
CHLORIDE: 106 mmol/L (ref 98–111)
CO2: 26 mmol/L (ref 22–32)
Calcium: 8.8 mg/dL — ABNORMAL LOW (ref 8.9–10.3)
Creatinine, Ser: 0.76 mg/dL (ref 0.44–1.00)
GFR calc Af Amer: >60 mL/min (ref >60)
GFR calc non Af Amer: >60 mL/min (ref >60)
Glucose, Bld: 97 mg/dL (ref 70–99)
POTASSIUM: 3.7 mmol/L (ref 3.5–5.1)
SODIUM: 138 mmol/L (ref 135–145)

## 2018-03-27 LAB — CBC
HEMATOCRIT: 28.2 % — AB (ref 35.0–47.0)
HEMOGLOBIN: 9 g/dL — AB (ref 12.0–16.0)
MCH: 23.7 pg — AB (ref 26.0–34.0)
MCHC: 31.8 g/dL — ABNORMAL LOW (ref 32.0–36.0)
MCV: 74.6 fL — ABNORMAL LOW (ref 80.0–100.0)
Platelets: 292 10*3/uL (ref 150–440)
RBC: 3.78 MIL/uL — AB (ref 3.80–5.20)
RDW: 18.9 % — ABNORMAL HIGH (ref 11.5–14.5)
WBC: 4.3 10*3/uL (ref 3.6–11.0)

## 2018-03-27 LAB — PHOSPHORUS: Phosphorus: 2.9 mg/dL (ref 2.5–4.6)

## 2018-03-27 LAB — MAGNESIUM: MAGNESIUM: 2.1 mg/dL (ref 1.7–2.4)

## 2018-03-27 LAB — HIV ANTIBODY (ROUTINE TESTING W REFLEX): HIV Screen 4th Generation wRfx: NONREACTIVE

## 2018-03-27 MED ORDER — POTASSIUM CHLORIDE 2 MEQ/ML IV SOLN
INTRAVENOUS | Status: DC
  Administered 2018-03-27 – 2018-03-28 (×2): via INTRAVENOUS
  Filled 2018-03-27 (×5): qty 1000

## 2018-03-27 NOTE — Progress Notes (Signed)
Per MD okay for RN to remove NG tube and start clear liquids diet.  

## 2018-03-28 LAB — PHOSPHORUS: PHOSPHORUS: 3.6 mg/dL (ref 2.5–4.6)

## 2018-03-28 LAB — CBC
HEMATOCRIT: 29.8 % — AB (ref 35.0–47.0)
HEMOGLOBIN: 9.6 g/dL — AB (ref 12.0–16.0)
MCH: 23.8 pg — AB (ref 26.0–34.0)
MCHC: 32.1 g/dL (ref 32.0–36.0)
MCV: 74.1 fL — ABNORMAL LOW (ref 80.0–100.0)
Platelets: 317 10*3/uL (ref 150–440)
RBC: 4.02 MIL/uL (ref 3.80–5.20)
RDW: 18.6 % — ABNORMAL HIGH (ref 11.5–14.5)
WBC: 4.3 10*3/uL (ref 3.6–11.0)

## 2018-03-28 LAB — BASIC METABOLIC PANEL
ANION GAP: 6 (ref 5–15)
BUN: 9 mg/dL (ref 6–20)
CALCIUM: 9 mg/dL (ref 8.9–10.3)
CO2: 25 mmol/L (ref 22–32)
CREATININE: 0.68 mg/dL (ref 0.44–1.00)
Chloride: 108 mmol/L (ref 98–111)
Glucose, Bld: 101 mg/dL — ABNORMAL HIGH (ref 70–99)
Potassium: 4.4 mmol/L (ref 3.5–5.1)
SODIUM: 139 mmol/L (ref 135–145)

## 2018-03-28 LAB — MAGNESIUM: MAGNESIUM: 2.1 mg/dL (ref 1.7–2.4)

## 2018-03-28 NOTE — Discharge Summary (Signed)
Physician Discharge Summary  Patient ID: Nicole Walsh MRN: [id number] DOB/AGE: [id number] 58 y.o.  Admit date: 03/25/2018 Discharge date: 03/28/2018  Admission Diagnoses: SBO  Discharge Diagnoses:  Same as above  Discharged Condition: good  Hospital Course: admitted for SBO.  Admitted for bowel rest, NG decompression, IVF.  Had return of flatus, BM, NG removed and tolerated diet.  At time of discharge, patient asymptomatic, tolerating diet, having BMs.  Consults: None  Discharge Exam: Blood pressure 123/76, pulse 79, temperature 98.3 F (36.8 C), temperature source Oral, resp. rate 17, height 5\' 5"  (1.651 m), weight 72.6 kg, SpO2 96 %. General appearance: alert, cooperative and no distress GI: soft, non-tender; bowel sounds normal; no masses,  no organomegaly  Disposition:  Discharge disposition: 01-Home or Self Care       Discharge Instructions    Discharge patient   Complete by:  As directed    Discharge disposition:  01-Home or Self Care   Discharge patient date:  03/28/2018     Allergies as of 03/28/2018   No Known Allergies     Medication List    TAKE these medications   dicyclomine 20 MG tablet Commonly known as:  BENTYL Take 1 tablet (20 mg total) by mouth 3 (three) times daily as needed (abdominal pain).   ondansetron 4 MG tablet Commonly known as:  ZOFRAN Take 1 tablet (4 mg total) by mouth every 8 (eight) hours as needed for nausea or vomiting.   sucralfate 1 g tablet Commonly known as:  CARAFATE Take 1 tablet (1 g total) by mouth 4 (four) times daily.         Total time spent arranging discharge was >54min. Signed: Benjamine Sprague 03/28/2018, 8:15 PM 2

## 2018-03-28 NOTE — Care Plan (Signed)
Hospital Note regarding recent hospitalization.  To Whom it may Concern,  Ms. Oleta Gunnoe was recently hospitalized from 03/24/18-03/28/2018.  Please excuse her from work during this timeframe.  She is ok to return to work with no restrictions at this time.  Please call our office at [phone] with any further questions.   Benjamine Sprague, DO General Surgeon, [facility]  Clinical Associate Dept of [place] [facility]  [address] [place], [place] [address] Phone: [phone] Fax: [phone]

## 2018-03-28 NOTE — Discharge Instructions (Signed)
No need for followup as an outpatient

## 2018-03-28 NOTE — Progress Notes (Signed)
Nicole Walsh  A and O x 4. VSS. Pt tolerating diet well. No complaints of pain or nausea. IV removed intact, no new prescriptions given. Pt voiced understanding of discharge instructions with no further questions. Pt discharged via wheelchair with nurse tech.     Allergies as of 03/28/2018   No Known Allergies     Medication List    TAKE these medications   dicyclomine 20 MG tablet Commonly known as:  BENTYL Take 1 tablet (20 mg total) by mouth 3 (three) times daily as needed (abdominal pain).   ondansetron 4 MG tablet Commonly known as:  ZOFRAN Take 1 tablet (4 mg total) by mouth every 8 (eight) hours as needed for nausea or vomiting.   sucralfate 1 g tablet Commonly known as:  CARAFATE Take 1 tablet (1 g total) by mouth 4 (four) times daily.       Vitals:   03/27/18 2057 03/28/18 0530  BP: 114/71 123/76  Pulse: 82 79  Resp: 18 17  Temp: 98.4 F (36.9 C) 98.3 F (36.8 C)  SpO2: 97% 96%    Francesco Sor

## 2018-07-05 ENCOUNTER — Other Ambulatory Visit: Payer: Self-pay

## 2018-07-05 ENCOUNTER — Encounter: Payer: Self-pay | Admitting: Emergency Medicine

## 2018-07-05 DIAGNOSIS — K56609 Unspecified intestinal obstruction, unspecified as to partial versus complete obstruction: Principal | ICD-10-CM | POA: Diagnosis present

## 2018-07-05 DIAGNOSIS — R109 Unspecified abdominal pain: Secondary | ICD-10-CM | POA: Diagnosis not present

## 2018-07-05 DIAGNOSIS — Z9049 Acquired absence of other specified parts of digestive tract: Secondary | ICD-10-CM

## 2018-07-05 LAB — URINALYSIS, COMPLETE (UACMP) WITH MICROSCOPIC
Bacteria, UA: NONE SEEN
Bilirubin Urine: NEGATIVE
Glucose, UA: NEGATIVE mg/dL
Hgb urine dipstick: NEGATIVE
Ketones, ur: 20 mg/dL — AB
Leukocytes, UA: NEGATIVE
Nitrite: NEGATIVE
PROTEIN: NEGATIVE mg/dL
Specific Gravity, Urine: 1.02 (ref 1.005–1.030)
pH: 8 (ref 5.0–8.0)

## 2018-07-05 LAB — CBC
HCT: 32.3 % — ABNORMAL LOW (ref 36.0–46.0)
Hemoglobin: 9.7 g/dL — ABNORMAL LOW (ref 12.0–15.0)
MCH: 23.2 pg — AB (ref 26.0–34.0)
MCHC: 30 g/dL (ref 30.0–36.0)
MCV: 77.1 fL — AB (ref 80.0–100.0)
PLATELETS: 346 10*3/uL (ref 150–400)
RBC: 4.19 MIL/uL (ref 3.87–5.11)
RDW: 17.3 % — ABNORMAL HIGH (ref 11.5–15.5)
WBC: 10.4 10*3/uL (ref 4.0–10.5)
nRBC: 0 % (ref 0.0–0.2)

## 2018-07-05 MED ORDER — ONDANSETRON 4 MG PO TBDP
4.0000 mg | ORAL_TABLET | Freq: Once | ORAL | Status: AC | PRN
  Administered 2018-07-05: 4 mg via ORAL
  Filled 2018-07-05: qty 1

## 2018-07-05 NOTE — ED Triage Notes (Signed)
Pt presents to ED with left sided lower abd pain that radiates to her back for the past week but worse the past couple of days. Pt states she had similar symptoms a few months ago and was admitted for a blockage. +vomiting.

## 2018-07-06 ENCOUNTER — Inpatient Hospital Stay
Admission: EM | Admit: 2018-07-06 | Discharge: 2018-07-09 | DRG: 390 | Disposition: A | Discharge status: Home / Self Care | Payer: BLUE CROSS/BLUE SHIELD | Attending: General Surgery | Admitting: General Surgery

## 2018-07-06 ENCOUNTER — Other Ambulatory Visit: Payer: Self-pay

## 2018-07-06 ENCOUNTER — Inpatient Hospital Stay: Payer: BLUE CROSS/BLUE SHIELD

## 2018-07-06 ENCOUNTER — Emergency Department: Payer: BLUE CROSS/BLUE SHIELD

## 2018-07-06 DIAGNOSIS — K56609 Unspecified intestinal obstruction, unspecified as to partial versus complete obstruction: Secondary | ICD-10-CM | POA: Diagnosis present

## 2018-07-06 DIAGNOSIS — Z9049 Acquired absence of other specified parts of digestive tract: Secondary | ICD-10-CM | POA: Diagnosis not present

## 2018-07-06 DIAGNOSIS — R109 Unspecified abdominal pain: Secondary | ICD-10-CM | POA: Diagnosis present

## 2018-07-06 HISTORY — DX: Disorder of kidney and ureter, unspecified: N28.9

## 2018-07-06 LAB — BASIC METABOLIC PANEL
Anion gap: 7 (ref 5–15)
BUN: 15 mg/dL (ref 6–20)
CALCIUM: 8.7 mg/dL — AB (ref 8.9–10.3)
CO2: 24 mmol/L (ref 22–32)
Chloride: 106 mmol/L (ref 98–111)
Creatinine, Ser: 0.84 mg/dL (ref 0.44–1.00)
GFR calc Af Amer: >60 mL/min (ref >60)
GFR calc non Af Amer: >60 mL/min (ref >60)
Glucose, Bld: 149 mg/dL — ABNORMAL HIGH (ref 70–99)
Potassium: 4.2 mmol/L (ref 3.5–5.1)
Sodium: 137 mmol/L (ref 135–145)

## 2018-07-06 LAB — COMPREHENSIVE METABOLIC PANEL
ALT: 15 U/L (ref 0–44)
ANION GAP: 9 (ref 5–15)
AST: 22 U/L (ref 15–41)
Albumin: 4.5 g/dL (ref 3.5–5.0)
Alkaline Phosphatase: 109 U/L (ref 38–126)
BILIRUBIN TOTAL: 0.5 mg/dL (ref 0.3–1.2)
BUN: 18 mg/dL (ref 6–20)
CHLORIDE: 104 mmol/L (ref 98–111)
CO2: 24 mmol/L (ref 22–32)
Calcium: 9.8 mg/dL (ref 8.9–10.3)
Creatinine, Ser: 0.7 mg/dL (ref 0.44–1.00)
GFR calc Af Amer: >60 mL/min (ref >60)
Glucose, Bld: 122 mg/dL — ABNORMAL HIGH (ref 70–99)
Potassium: 3.9 mmol/L (ref 3.5–5.1)
Sodium: 137 mmol/L (ref 135–145)
Total Protein: 7.1 g/dL (ref 6.5–8.1)

## 2018-07-06 LAB — LIPASE, BLOOD: LIPASE: 37 U/L (ref 11–51)

## 2018-07-06 MED ORDER — HYDROMORPHONE HCL 1 MG/ML IJ SOLN
1.0000 mg | Freq: Once | INTRAMUSCULAR | Status: AC
  Administered 2018-07-06: 1 mg via INTRAVENOUS

## 2018-07-06 MED ORDER — ACETAMINOPHEN 650 MG RE SUPP: 650.0000 mg | Freq: Four times a day (QID) | RECTAL | Status: DC | PRN

## 2018-07-06 MED ORDER — IOPAMIDOL (ISOVUE-300) INJECTION 61%
100.0000 mL | Freq: Once | INTRAVENOUS | Status: AC | PRN
  Administered 2018-07-06: 100 mL via INTRAVENOUS

## 2018-07-06 MED ORDER — ONDANSETRON HCL 4 MG/2ML IJ SOLN
4.0000 mg | Freq: Once | INTRAMUSCULAR | Status: AC
  Administered 2018-07-06: 4 mg via INTRAVENOUS
  Filled 2018-07-06: qty 2

## 2018-07-06 MED ORDER — SODIUM CHLORIDE 0.9 % IV SOLN
INTRAVENOUS | Status: DC
  Administered 2018-07-06 – 2018-07-08 (×7): via INTRAVENOUS

## 2018-07-06 MED ORDER — HYDROMORPHONE HCL 1 MG/ML IJ SOLN
INTRAMUSCULAR | Status: AC
  Filled 2018-07-06: qty 1

## 2018-07-06 MED ORDER — SODIUM CHLORIDE 0.9 % IV BOLUS
1000.0000 mL | Freq: Once | INTRAVENOUS | Status: AC
  Administered 2018-07-06: 1000 mL via INTRAVENOUS

## 2018-07-06 MED ORDER — ONDANSETRON 4 MG PO TBDP: 4.0000 mg | ORAL_TABLET | Freq: Four times a day (QID) | ORAL | Status: DC | PRN

## 2018-07-06 MED ORDER — FAMOTIDINE IN NACL 20-0.9 MG/50ML-% IV SOLN
20.0000 mg | Freq: Two times a day (BID) | INTRAVENOUS | Status: DC
  Administered 2018-07-06 – 2018-07-09 (×7): 20 mg via INTRAVENOUS
  Filled 2018-07-06 (×12): qty 50

## 2018-07-06 MED ORDER — KETOROLAC TROMETHAMINE 30 MG/ML IJ SOLN
30.0000 mg | Freq: Four times a day (QID) | INTRAMUSCULAR | Status: DC | PRN
  Administered 2018-07-06: 30 mg via INTRAVENOUS
  Filled 2018-07-06: qty 1

## 2018-07-06 MED ORDER — ENOXAPARIN SODIUM 40 MG/0.4ML ~~LOC~~ SOLN
40.0000 mg | SUBCUTANEOUS | Status: DC
  Administered 2018-07-06 – 2018-07-08 (×3): 40 mg via SUBCUTANEOUS
  Filled 2018-07-06 (×3): qty 0.4

## 2018-07-06 MED ORDER — ONDANSETRON HCL 4 MG/2ML IJ SOLN: 4.0000 mg | Freq: Four times a day (QID) | INTRAMUSCULAR | Status: DC | PRN

## 2018-07-06 MED ORDER — MORPHINE SULFATE (PF) 4 MG/ML IV SOLN
4.0000 mg | Freq: Once | INTRAVENOUS | Status: AC
  Administered 2018-07-06: 4 mg via INTRAVENOUS
  Filled 2018-07-06: qty 1

## 2018-07-06 MED ORDER — ACETAMINOPHEN 325 MG PO TABS: 650.0000 mg | ORAL_TABLET | Freq: Four times a day (QID) | ORAL | Status: DC | PRN

## 2018-07-06 NOTE — ED Notes (Signed)
Pt actively vomiting.

## 2018-07-06 NOTE — ED Notes (Signed)
Pt on RA sat 93%.

## 2018-07-06 NOTE — ED Notes (Signed)
CT aware pt has IV access; using interpreter now to obtain consent for CT.

## 2018-07-06 NOTE — ED Notes (Signed)
NG tube placed. Marked at 55 with sharpie & tape. Applied to low suction int.

## 2018-07-06 NOTE — ED Notes (Signed)
Pt desat to 65% and became lethargic immediately after receiving pain med. Came back up to 100% on own after 26min but placed on 2L O2 [place] per this RN judgement.

## 2018-07-06 NOTE — Progress Notes (Signed)
[facility] Day(s): 0.   Post op day(s):  Marland Kitchen   Interval History: Patient seen and examined again. Patient refers feeling better.  Vital signs in last 24 hours: [min-max] current  Temp:  [98 F (36.7 C)-99.8 F (37.7 C)] 99.8 F (37.7 C) (01/10 1611) Pulse Rate:  [86-106] 90 (01/10 1611) Resp:  [16-20] 18 (01/10 1611) BP: (114-165)/(66-91) 123/71 (01/10 1611) SpO2:  [65 %-100 %] 95 % (01/10 1611) Weight:  [74.4 kg] 74.4 kg (01/09 2327)     Height: 5\' 5"  (165.1 cm) Weight: 74.4 kg BMI (Calculated): 27.29    Physical Exam:  Constitutional: alert, cooperative and no distress  Gastrointestinal: soft, non-tender, and non-distended  Labs:  CBC Latest Ref Rng & Units 07/05/2018 03/28/2018 03/27/2018  WBC 4.0 - 10.5 K/uL 10.4 4.3 4.3  Hemoglobin 12.0 - 15.0 g/dL 9.7(L) 9.6(L) 9.0(L)  Hematocrit 36.0 - 46.0 % 32.3(L) 29.8(L) 28.2(L)  Platelets 150 - 400 K/uL 346 317 292   CMP Latest Ref Rng & Units 07/06/2018 07/05/2018 03/28/2018  Glucose 70 - 99 mg/dL 149(H) 122(H) 101(H)  BUN 6 - 20 mg/dL 15 18 9   Creatinine 0.44 - 1.00 mg/dL 0.84 0.70 0.68  Sodium 135 - 145 mmol/L 137 137 139  Potassium 3.5 - 5.1 mmol/L 4.2 3.9 4.4  Chloride 98 - 111 mmol/L 106 104 108  CO2 22 - 32 mmol/L 24 24 25   Calcium 8.9 - 10.3 mg/dL 8.7(L) 9.8 9.0  Total Protein 6.5 - 8.1 g/dL - 7.1 -  Total Bilirubin 0.3 - 1.2 mg/dL - 0.5 -  Alkaline Phos 38 - 126 U/L - 109 -  AST 15 - 41 U/L - 22 -  ALT 0 - 44 U/L - 15 -    Imaging studies: No new pertinent imaging studies   Assessment/Plan:  58 y.o. female with small bowel obstruction.  Patient refers feeling better with NGT in place. Refers still not passing flatus. Denies abdominal pain. No fever. Will continue with plan of decompression of small bowel, keep patient hydrated with IVF's and continue expectant management.   Patient re oriented about plan.  This encounter was 30 minutes.  Arnold Long, MD

## 2018-07-06 NOTE — ED Notes (Signed)
Pt attempting to stand up at bedside. Educated to remain laying in bed bc she could become dizzy d/t recent pain med given.

## 2018-07-06 NOTE — ED Notes (Signed)
Surgery MD at bedside to speak with pt/family.

## 2018-07-06 NOTE — ED Provider Notes (Signed)
[facility] Emergency Department Provider Note   First MD Initiated Contact with Patient 07/06/18 [phone]     (approximate)  I have reviewed the triage vital signs and the nursing notes.   HISTORY  Chief Complaint Abdominal Pain    HPI Nicole Walsh is a 58 y.o. female with below list of chronic medical conditions including previous bowel obstruction September 2019 presents to the emergency department with cute onset of left upper lower quadrant abdominal pain radiates to her back which patient states have been occurring for the past week.  Patient states that the pain is worsened over the last 2 days.  Patient does admit to accompanying nausea and vomiting.  Patient denies any diarrhea.  Patient denies any fever afebrile on presentation.   Past Medical History:  Diagnosis Date  . Renal disorder     Patient Active Problem List   Diagnosis Date Noted  . Small bowel obstruction ([place]) 07/06/2018  . SBO (small bowel obstruction) ([place]) 03/25/2018    History reviewed. No pertinent surgical history.  Prior to Admission medications   Medication Sig Start Date End Date Taking? Authorizing Provider  dicyclomine (BENTYL) 20 MG tablet Take 1 tablet (20 mg total) by mouth 3 (three) times daily as needed (abdominal pain). 03/24/18   Nance Pear, MD  ondansetron (ZOFRAN) 4 MG tablet Take 1 tablet (4 mg total) by mouth every 8 (eight) hours as needed for nausea or vomiting. 03/24/18   Nance Pear, MD  sucralfate (CARAFATE) 1 g tablet Take 1 tablet (1 g total) by mouth 4 (four) times daily. 03/24/18   Nance Pear, MD    Allergies No known drug allergies  Family History  Problem Relation Age of Onset  . Breast cancer Neg Hx     Social History Social History   Tobacco Use  . Smoking status: Never Smoker  . Smokeless tobacco: Never Used  Substance Use Topics  . Alcohol use: Never    Frequency: Never  . Drug use: Never    Review of  Systems Constitutional: No fever/chills Eyes: No visual changes. ENT: No sore throat. Cardiovascular: Denies chest pain. Respiratory: Denies shortness of breath. Gastrointestinal: Positive for abdominal pain nausea and vomiting. Genitourinary: Negative for dysuria. Musculoskeletal: Negative for neck pain.  Negative for back pain. Integumentary: Negative for rash. Neurological: Negative for headaches, focal weakness or numbness.   ____________________________________________   PHYSICAL EXAM:  VITAL SIGNS: ED Triage Vitals  Enc Vitals Group     BP 07/05/18 2326 136/73     Pulse Rate 07/05/18 2326 95     Resp 07/05/18 2326 20     Temp 07/05/18 2326 98.5 F (36.9 C)     Temp Source 07/05/18 2326 Oral     SpO2 07/05/18 2326 100 %     Weight 07/05/18 2327 74.4 kg (164 lb)     Height 07/05/18 2327 1.651 m (5\' 5" )     Head Circumference --      Peak Flow --      Pain Score 07/05/18 2326 10     Pain Loc --      Pain Edu? --      Excl. in [place]? --     Constitutional: Alert and oriented.  Apparent discomfort  eyes: Conjunctivae are normal. Mouth/Throat: Mucous membranes are moist.  Oropharynx non-erythematous. Neck: No stridor.   Cardiovascular: Normal rate, regular rhythm. Good peripheral circulation. Grossly normal heart sounds. Respiratory: Normal respiratory effort.  No retractions. Lungs CTAB. Gastrointestinal:  Soft and nontender. No distention.  Musculoskeletal: No lower extremity tenderness nor edema. No gross deformities of extremities. Neurologic:  Normal speech and language. No gross focal neurologic deficits are appreciated.  Skin:  Skin is warm, dry and intact. No rash noted. Psychiatric: Mood and affect are normal. Speech and behavior are normal.  ____________________________________________   LABS (all labs ordered are listed, but only abnormal results are displayed)  Labs Reviewed  COMPREHENSIVE METABOLIC PANEL - Abnormal; Notable for the following  components:      Result Value   Glucose, Bld 122 (*)    All other components within normal limits  CBC - Abnormal; Notable for the following components:   Hemoglobin 9.7 (*)    HCT 32.3 (*)    MCV 77.1 (*)    MCH 23.2 (*)    RDW 17.3 (*)    All other components within normal limits  URINALYSIS, COMPLETE (UACMP) WITH MICROSCOPIC - Abnormal; Notable for the following components:   Color, Urine YELLOW (*)    APPearance CLEAR (*)    Ketones, ur 20 (*)    All other components within normal limits  LIPASE, BLOOD  BASIC METABOLIC PANEL   _______________  RADIOLOGY I, [place] Taila Basinski, personally viewed and evaluated these images (plain radiographs) as part of my medical decision making, as well as reviewing the written report by the radiologist.  ED MD interpretation: CT scan of the abdomen pelvis concerning for possible small bowel obstruction per radiologist.  Official radiology report(s): Ct Abdomen Pelvis W Contrast  Result Date: 07/06/2018 CLINICAL DATA:  Abdominal pain EXAM: CT ABDOMEN AND PELVIS WITH CONTRAST TECHNIQUE: Multidetector CT imaging of the abdomen and pelvis was performed using the standard protocol following bolus administration of intravenous contrast. CONTRAST:  170mL ISOVUE-300 IOPAMIDOL (ISOVUE-300) INJECTION 61% COMPARISON:  CT 03/25/2018 FINDINGS: Lower chest: Lung bases demonstrate no acute consolidation or effusion. The heart size is normal. Hepatobiliary: Status post cholecystectomy. Mild intra hepatic and moderate extrahepatic biliary enlargement, extrahepatic bile duct measures up to 18 mm. This does not appear significantly changed. Pancreas: Unremarkable. No pancreatic ductal dilatation or surrounding inflammatory changes. Spleen: Normal in size without focal abnormality. Adrenals/Urinary Tract: Adrenal glands are unremarkable. Kidneys are normal, without renal calculi, focal lesion, or hydronephrosis. Bladder is unremarkable. Stomach/Bowel: The stomach is  nonenlarged. Multiple fluid-filled loops of dilated small bowel measuring up to 3.8 cm. Fecalized segment of bowel in the right hemiabdomen. Areas of decreased caliber small bowel with mild wall thickening and mucosal enhancement within the anterior mid pelvis, series 2, image number 89., and within the right mid abdomen, series 2, image number 47. Negative appendix. Sigmoid colon diverticula without acute inflammatory process. Vascular/Lymphatic: No hyperdense vessels. No aneurysmal dilatation. No significantly enlarged lymph nodes Reproductive: Uterus and bilateral adnexa are unremarkable. Other: Negative for free air or free fluid Musculoskeletal: Fused appearance T11-T12. No acute or suspicious abnormality. IMPRESSION: 1. Multiple loops of dilated fluid-filled small bowel with scattered areas of decompressed small bowel with mild bowel wall thickening and mucosal enhancement. Findings are suspicious for small bowel obstruction. Negative for perforation. 2. 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 Status post cholecystectomy. Mild intra hepatic biliary dilatation and moderate extrahepatic biliary enlargement. Could be due to surgical change. Suggest correlation with LFTs. Electronically Signed   By: Donavan Foil M.D.   On: 07/06/2018 02:10      Procedures   ____________________________________________   INITIAL IMPRESSION / ASSESSMENT AND PLAN / ED COURSE  As part of  my medical decision making, I reviewed the following data within the electronic MEDICAL RECORD NUMBER  58 year old female presenting with above-stated history and physical exam concerning for possible diverticulitis versus small bowel obstruction.  Patient given IV morphine 4 mg with minimal improvement of pain and as such 1 mg IV Dilaudid given with improvement of pain.  After reviewing the CT scan NG tube ordered.  Patient discussed with Dr. Peyton Najjar neurosurgeon on-call who accepted the patient for  admission.    ____________________________________________  FINAL CLINICAL IMPRESSION(S) / ED DIAGNOSES  Final diagnoses:  Small bowel obstruction ([place])     MEDICATIONS GIVEN DURING THIS VISIT:  Medications  HYDROmorphone (DILAUDID) 1 MG/ML injection (0 mg  Hold 07/06/18 0149)  enoxaparin (LOVENOX) injection 40 mg (has no administration in time range)  0.9 %  sodium chloride infusion (has no administration in time range)  acetaminophen (TYLENOL) tablet 650 mg (has no administration in time range)    Or  acetaminophen (TYLENOL) suppository 650 mg (has no administration in time range)  ondansetron (ZOFRAN-ODT) disintegrating tablet 4 mg (has no administration in time range)    Or  ondansetron (ZOFRAN) injection 4 mg (has no administration in time range)  famotidine (PEPCID) IVPB 20 mg premix (has no administration in time range)  ondansetron (ZOFRAN-ODT) disintegrating tablet 4 mg (4 mg Oral Given 07/05/18 2339)  morphine 4 MG/ML injection 4 mg (4 mg Intravenous Given 07/06/18 0104)  ondansetron (ZOFRAN) injection 4 mg (4 mg Intravenous Given 07/06/18 0104)  sodium chloride 0.9 % bolus 1,000 mL (0 mLs Intravenous Stopped 07/06/18 0220)  iopamidol (ISOVUE-300) 61 % injection 100 mL (100 mLs Intravenous Contrast Given 07/06/18 0120)  HYDROmorphone (DILAUDID) injection 1 mg (1 mg Intravenous Given 07/06/18 0145)     ED Discharge Orders    None       Note:  This document was prepared using Dragon voice recognition software and may include unintentional dictation errors.    Gregor Hams, MD 07/06/18 [id number]

## 2018-07-06 NOTE — H&P (Signed)
SURGICAL HISTORY AND PHYSICAL NOTE   HISTORY OF PRESENT ILLNESS (HPI):  58 y.o. female presented to [facility] ED for evaluation of abdominal pain, nausea and vomiting. Patient reports that pain has been present for around a week but has progressed in the last couple of days. Pain mostly on the left side of the abdomen. Pain radiates to the back. There is no aggravator or alleviator factor. Pain associated with multiple episodes of nausea and vomiting. Last vomit around 3:30 am of undigested food. Denies fever or chills. At ED, CT scan was performed. I personally evaluated the images appreciating the small bowel loops dilation with collapsed distal small bowel and large intestine. No free air or free fluids.   Surgery is consulted by Dr. Owens Shark in this context for evaluation and management of small bowel obstruction.  PAST MEDICAL HISTORY (PMH):  Past Medical History:  Diagnosis Date  . Renal disorder      PAST SURGICAL HISTORY ([place]):  Laparoscopic cholecystectomy   MEDICATIONS:  Prior to Admission medications   Medication Sig Start Date End Date Taking? Authorizing Provider  dicyclomine (BENTYL) 20 MG tablet Take 1 tablet (20 mg total) by mouth 3 (three) times daily as needed (abdominal pain). 03/24/18   Nance Pear, MD  ondansetron (ZOFRAN) 4 MG tablet Take 1 tablet (4 mg total) by mouth every 8 (eight) hours as needed for nausea or vomiting. 03/24/18   Nance Pear, MD  sucralfate (CARAFATE) 1 g tablet Take 1 tablet (1 g total) by mouth 4 (four) times daily. 03/24/18   Nance Pear, MD     ALLERGIES:  No Known Allergies   SOCIAL HISTORY:  Social History   Socioeconomic History  . Marital status: Married    Spouse name: Not on file  . Number of children: Not on file  . Years of education: Not on file  . Highest education level: Not on file  Occupational History  . Not on file  Social Needs  . Financial resource strain: Not on file  . Food insecurity:    Worry: Not on  file    Inability: Not on file  . Transportation needs:    Medical: Not on file    Non-medical: Not on file  Tobacco Use  . Smoking status: Never Smoker  . Smokeless tobacco: Never Used  Substance and Sexual Activity  . Alcohol use: Never    Frequency: Never  . Drug use: Never  . Sexual activity: Not on file  Lifestyle  . Physical activity:    Days per week: Not on file    Minutes per session: Not on file  . Stress: Not on file  Relationships  . Social connections:    Talks on phone: Not on file    Gets together: Not on file    Attends religious service: Not on file    Active member of club or organization: Not on file    Attends meetings of clubs or organizations: Not on file    Relationship status: Not on file  . Intimate partner violence:    Fear of current or ex partner: Not on file    Emotionally abused: Not on file    Physically abused: Not on file    Forced sexual activity: Not on file  Other Topics Concern  . Not on file  Social History Narrative  . Not on file    The patient currently resides (home / rehab facility / nursing home): Home The patient normally is (ambulatory / bedbound):  Ambulatory   FAMILY HISTORY:  Family History  Problem Relation Age of Onset  . Breast cancer Neg Hx      REVIEW OF SYSTEMS:  Constitutional: denies weight loss, fever, chills, or sweats  Eyes: denies any other vision changes, history of eye injury  ENT: denies sore throat, hearing problems  Respiratory: denies shortness of breath, wheezing  Cardiovascular: denies chest pain, palpitations  Gastrointestinal: positive abdominal pain, N/V Genitourinary: denies burning with urination or urinary frequency Musculoskeletal: denies any other joint pains or cramps  Skin: denies any other rashes or skin discolorations  Neurological: denies any other headache, dizziness, weakness  Psychiatric: denies any other depression, anxiety   All other review of systems were negative    VITAL SIGNS:  Temp:  [98 F (36.7 C)-98.5 F (36.9 C)] 98 F (36.7 C) (01/10 0358) Pulse Rate:  [90-106] 95 (01/10 0358) Resp:  [20] 20 (01/09 2326) BP: (127-165)/(73-91) 139/81 (01/10 0358) SpO2:  [65 %-100 %] 97 % (01/10 0358) Weight:  [74.4 kg] 74.4 kg (01/09 2327)     Height: 5\' 5"  (165.1 cm) Weight: 74.4 kg BMI (Calculated): 27.29   INTAKE/OUTPUT:  This shift: No intake/output data recorded.  Last 2 shifts: @IOLAST2SHIFTS @   PHYSICAL EXAM:  Constitutional:  -- Normal body habitus  -- Awake, alert, and oriented x3  Eyes:  -- Pupils equally round and reactive to light  -- No scleral icterus  Ear, nose, and throat:  -- No jugular venous distension  Pulmonary:  -- No crackles  -- Equal breath sounds bilaterally -- Breathing non-labored at rest Cardiovascular:  -- S1, S2 present  -- No pericardial rubs Gastrointestinal:  -- Abdomen soft, mild tender on lower quadrants, distended, no guarding or rebound tenderness -- No abdominal masses appreciated, pulsatile or otherwise  Musculoskeletal and Integumentary:  -- Wounds or skin discoloration: None appreciated -- Extremities: B/L UE and LE FROM, hands and feet warm, no edema  Neurologic:  -- Motor function: intact and symmetric -- Sensation: intact and symmetric   Labs:  CBC Latest Ref Rng & Units 07/05/2018 03/28/2018 03/27/2018  WBC 4.0 - 10.5 K/uL 10.4 4.3 4.3  Hemoglobin 12.0 - 15.0 g/dL 9.7(L) 9.6(L) 9.0(L)  Hematocrit 36.0 - 46.0 % 32.3(L) 29.8(L) 28.2(L)  Platelets 150 - 400 K/uL 346 317 292   CMP Latest Ref Rng & Units 07/05/2018 03/28/2018 03/27/2018  Glucose 70 - 99 mg/dL 122(H) 101(H) 97  BUN 6 - 20 mg/dL 18 9 10   Creatinine 0.44 - 1.00 mg/dL 0.70 0.68 0.76  Sodium 135 - 145 mmol/L 137 139 138  Potassium 3.5 - 5.1 mmol/L 3.9 4.4 3.7  Chloride 98 - 111 mmol/L 104 108 106  CO2 22 - 32 mmol/L 24 25 26   Calcium 8.9 - 10.3 mg/dL 9.8 9.0 8.8(L)  Total Protein 6.5 - 8.1 g/dL 7.1 - -  Total Bilirubin 0.3 - 1.2  mg/dL 0.5 - -  Alkaline Phos 38 - 126 U/L 109 - -  AST 15 - 41 U/L 22 - -  ALT 0 - 44 U/L 15 - -    Imaging studies:  EXAM: CT ABDOMEN AND PELVIS WITH CONTRAST  TECHNIQUE: Multidetector CT imaging of the abdomen and pelvis was performed using the standard protocol following bolus administration of intravenous contrast.  CONTRAST:  165mL ISOVUE-300 IOPAMIDOL (ISOVUE-300) INJECTION 61%  COMPARISON:  CT 03/25/2018  FINDINGS: Lower chest: Lung bases demonstrate no acute consolidation or effusion. The heart size is normal.  Hepatobiliary: Status post cholecystectomy. Mild intra hepatic  and moderate extrahepatic biliary enlargement, extrahepatic bile duct measures up to 18 mm. This does not appear significantly changed.  Pancreas: Unremarkable. No pancreatic ductal dilatation or surrounding inflammatory changes.  Spleen: Normal in size without focal abnormality.  Adrenals/Urinary Tract: Adrenal glands are unremarkable. Kidneys are normal, without renal calculi, focal lesion, or hydronephrosis. Bladder is unremarkable.  Stomach/Bowel: The stomach is nonenlarged. Multiple fluid-filled loops of dilated small bowel measuring up to 3.8 cm. Fecalized segment of bowel in the right hemiabdomen. Areas of decreased caliber small bowel with mild wall thickening and mucosal enhancement within the anterior mid pelvis, series 2, image number 89., and within the right mid abdomen, series 2, image number 47. Negative appendix. Sigmoid colon diverticula without acute inflammatory process.  Vascular/Lymphatic: No hyperdense vessels. No aneurysmal dilatation. No significantly enlarged lymph nodes  Reproductive: Uterus and bilateral adnexa are unremarkable.  Other: Negative for free air or free fluid  Musculoskeletal: Fused appearance T11-T12. No acute or suspicious abnormality.  IMPRESSION: 1. Multiple loops of dilated fluid-filled small bowel with scattered areas of  decompressed small bowel with mild bowel wall thickening and mucosal enhancement. Findings are suspicious for small bowel obstruction. Negative for perforation. 2. 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 Status post cholecystectomy. Mild intra hepatic biliary dilatation and moderate extrahepatic biliary enlargement. Could be due to surgical change. Suggest correlation with LFTs.   Electronically Signed   By: Donavan Foil M.D.   On: 07/06/2018 02:10  Assessment/Plan:  58 y.o. female with small bowel obstruction.  Patient and her husband were oriented about the diagnosis of small bowel obstruction. They were oriented about the rationale of initial non operative management trial and the possibility of surgical management if obstruction does not resolve. Initial management will consist of bowel decompression with NGT, hydration with IV fluids, bowel rest. No antibiotic needed at the moment. Will keep electrolytes within normal limits, which they are at this moment. Patient oriented to be aware of passing flatus. Encourage to ambulate when nausea resolves.    Arnold Long, MD

## 2018-07-07 ENCOUNTER — Inpatient Hospital Stay: Payer: BLUE CROSS/BLUE SHIELD

## 2018-07-07 NOTE — Progress Notes (Signed)
[facility] Day(s): 1.   Post op day(s):  Marland Kitchen   Interval History: Patient seen and examined, no acute events or new complaints overnight. Patient reports improvement of abdominal pain and distention. Refers had one episode of flatus. Denies nausea or vomitng.   Vital signs in last 24 hours: [min-max] current  Temp:  [98 F (36.7 C)-99.8 F (37.7 C)] 98 F (36.7 C) (01/11 0900) Pulse Rate:  [78-95] 78 (01/11 0900) Resp:  [18-20] 20 (01/11 0900) BP: (114-139)/(66-85) 139/85 (01/11 0900) SpO2:  [94 %-97 %] 97 % (01/11 0900)     Height: 5\' 5"  (165.1 cm) Weight: 74.4 kg BMI (Calculated): 27.29    Physical Exam:  Constitutional: alert, cooperative and no distress  Respiratory: breathing non-labored at rest  Cardiovascular: regular rate and sinus rhythm  Gastrointestinal: soft, non-tender, and non-distended  Labs:  CBC Latest Ref Rng & Units 07/05/2018 03/28/2018 03/27/2018  WBC 4.0 - 10.5 K/uL 10.4 4.3 4.3  Hemoglobin 12.0 - 15.0 g/dL 9.7(L) 9.6(L) 9.0(L)  Hematocrit 36.0 - 46.0 % 32.3(L) 29.8(L) 28.2(L)  Platelets 150 - 400 K/uL 346 317 292   CMP Latest Ref Rng & Units 07/06/2018 07/05/2018 03/28/2018  Glucose 70 - 99 mg/dL 149(H) 122(H) 101(H)  BUN 6 - 20 mg/dL 15 18 9   Creatinine 0.44 - 1.00 mg/dL 0.84 0.70 0.68  Sodium 135 - 145 mmol/L 137 137 139  Potassium 3.5 - 5.1 mmol/L 4.2 3.9 4.4  Chloride 98 - 111 mmol/L 106 104 108  CO2 22 - 32 mmol/L 24 24 25   Calcium 8.9 - 10.3 mg/dL 8.7(L) 9.8 9.0  Total Protein 6.5 - 8.1 g/dL - 7.1 -  Total Bilirubin 0.3 - 1.2 mg/dL - 0.5 -  Alkaline Phos 38 - 126 U/L - 109 -  AST 15 - 41 U/L - 22 -  ALT 0 - 44 U/L - 15 -    Imaging studies: No new pertinent imaging studies   Assessment/Plan:  58 y.o.femalewith small bowel obstruction.  Patient recovering slowly with now with abdominal distention and one episode of flatus. Will order xray and if improved will discontinue NGT and start clear liquid diet. Will continue  IV hydration and pain management as needed. Patient encourage to ambulate.    Arnold Long, MD

## 2018-07-07 NOTE — Plan of Care (Signed)
Assessment with assist from Interp. Johnsie Cancel #[id number]. Pt. Is alert and oriented with aprop. Affect. Denies c/o. Denies abdominal pain and/or nausea and vomiting. Abd. Is soft and non-tender with positive Bowel Sounds. Appears comfortable and in NAD.

## 2018-07-07 NOTE — Progress Notes (Signed)
RN entered room for hourly rounding, and patient reported through interpreter that she is experiencing abdominal fullness and discomfort.  Pt denies pain or nausea.  Pt reports that her intake from her clear liquid tray consisted of a cup of sweet tea, jello, and half of a cup of broth.  A regular diet tray was then brought into her room by Dining services. Education was provided when NG tube was removed by two RNs to only eat clear liquids, what clear liquids consist of, and to call RN before eating anything that is not on the clear liquid hospital menu. Pt states via interpreter that she "thought her doctor must have made a change" since a regular diet tray was brought into her room.  Pt reports eating 2 packs of crackers, a full plate of cantaloupe and grapes, a full bowl of cream of potato soup.  Pt and family re-educated on clear liquids and asked again to call RN before eating anything that is not on the hospital clear liquid diet menu.  Pt and family verbalize understanding via interpreter.  Dr. Ferrel Logan was called to notify of intake and symptoms.  MD states to leave pt on clear liquid diet and give ordered Zofran if pt experiences nausea.  Pt and family updated on MD response. Reed Breech, RN 07/07/2018 5:21 PM

## 2018-07-07 NOTE — Plan of Care (Signed)
T max 99.6 orally this shift; other vital signs stable; NG tube intact and connected to low intermittent suction; voiding; IV fluids continued; IV site clear; patient remains NPO; pain controlled with IV toradol; patient has slept at long intervals tonight.

## 2018-07-07 NOTE — Progress Notes (Signed)
I personally evaluated the images of the abdominal xray. There is no bowel dilation with contrast on the large intestine and gas. Patient passing flatus. Will discontinue NGT and start clear liquid diet.

## 2018-07-07 NOTE — Progress Notes (Signed)
Patient returned to room in wheelchair by orderly at 1104 on 07/07/18. Reed Breech, RN 07/07/2018

## 2018-07-07 NOTE — Progress Notes (Signed)
Patient transported to radiology via wheelchair by orderly at 1042 on 07/07/18. Reed Breech, RN 07/07/2018

## 2018-07-08 NOTE — Plan of Care (Signed)
VSS. Alert and Oriented with aprop. Affect. Color good, skin W&D. BBS clear. Denies N/V and/or Abdominal Pain via [place] # [phone]. Positive Bowel sounds. States BM today. Tolerating Full Liquid Diet.

## 2018-07-08 NOTE — Progress Notes (Signed)
[facility] Hospital Day(s): 2.   Post op day(s):  Marland Kitchen   Interval History: Patient seen and examined, no acute events or new complaints overnight. Patient reports had fullness on abdomen after eating solid diet given to her as an error, denies abdominal pain at this moment. Refers continue passing gas.   Vital signs in last 24 hours: [min-max] current  Temp:  [98 F (36.7 C)-98.9 F (37.2 C)] 98.9 F (37.2 C) (01/12 0827) Pulse Rate:  [67-86] 67 (01/12 0827) Resp:  [18-20] 20 (01/12 0827) BP: (126-145)/(61-81) 145/74 (01/12 0827) SpO2:  [96 %-98 %] 98 % (01/12 0827)     Height: 5\' 5"  (165.1 cm) Weight: 74.4 kg BMI (Calculated): 27.29    Physical Exam:  Constitutional: alert, cooperative and no distress  Respiratory: breathing non-labored at rest  Cardiovascular: regular rate and sinus rhythm  Gastrointestinal: soft, non-tender, and non-distended  Labs:  CBC Latest Ref Rng & Units 07/05/2018 03/28/2018 03/27/2018  WBC 4.0 - 10.5 K/uL 10.4 4.3 4.3  Hemoglobin 12.0 - 15.0 g/dL 9.7(L) 9.6(L) 9.0(L)  Hematocrit 36.0 - 46.0 % 32.3(L) 29.8(L) 28.2(L)  Platelets 150 - 400 K/uL 346 317 292   CMP Latest Ref Rng & Units 07/06/2018 07/05/2018 03/28/2018  Glucose 70 - 99 mg/dL 149(H) 122(H) 101(H)  BUN 6 - 20 mg/dL 15 18 9   Creatinine 0.44 - 1.00 mg/dL 0.84 0.70 0.68  Sodium 135 - 145 mmol/L 137 137 139  Potassium 3.5 - 5.1 mmol/L 4.2 3.9 4.4  Chloride 98 - 111 mmol/L 106 104 108  CO2 22 - 32 mmol/L 24 24 25   Calcium 8.9 - 10.3 mg/dL 8.7(L) 9.8 9.0  Total Protein 6.5 - 8.1 g/dL - 7.1 -  Total Bilirubin 0.3 - 1.2 mg/dL - 0.5 -  Alkaline Phos 38 - 126 U/L - 109 -  AST 15 - 41 U/L - 22 -  ALT 0 - 44 U/L - 15 -    Imaging studies: No new pertinent imaging studies   Assessment/Plan:  58 y.o.femalewith small bowel obstruction.  Tolerated clear liquid. Will advance to full liquid to day. Encourage to ambulate.   Arnold Long, MD

## 2018-07-09 NOTE — Progress Notes (Signed)
Dc home to car with auxillary

## 2018-07-09 NOTE — Progress Notes (Signed)
DC instr reviewed with pt via [facility].  Pt verb u/o of care for home and when to take meds.  Awaiting on daughter to come to hospital for d/c.

## 2018-07-09 NOTE — Discharge Instructions (Signed)
°  Diet: Resume home heart healthy regular diet.   Activity: Increase activity as tolerated. Light activity and walking are encouraged. Do not drive or drink alcohol if taking narcotic pain medications.  Medications: Resume all home medications except.   Call office [phone]) at any time if any questions, worsening pain, fevers/chills, bleeding, drainage from incision site, or other concerns.

## 2018-07-09 NOTE — Discharge Summary (Addendum)
  Patient ID: Nicole Walsh MRN: [id number] DOB/AGE: 59/31/1962 58 y.o.  Admit date: 07/06/2018 Discharge date: 07/09/2018   Discharge Diagnoses:  Active Problems:   Small bowel obstruction ([place])   Procedures: None  Hospital Course: Patient admitted with small bowel obstruction. The patient was treated with bowel decompression with NGT, pain management and IV fluids. Patient started passing gas. NGT was removed, and started on clear liquid diet. Patient tolerated and advanced from clear liquid to full liquid, to soft diet as tolerated.   Today patient is passing gas, having bowel movement, ambulating and tolerated meat and mashed potatoes. No abdominal pain.   Consults: None  Disposition: Discharge disposition: 01-Home or Self Care     This was a greater than 30 minute discharge encounter.   Discharge Instructions    Diet - low sodium heart healthy   Complete by:  As directed    Increase activity slowly   Complete by:  As directed      Allergies as of 07/09/2018   No Known Allergies     Medication List    TAKE these medications   dicyclomine 20 MG tablet Commonly known as:  BENTYL Take 1 tablet (20 mg total) by mouth 3 (three) times daily as needed (abdominal pain).   ondansetron 4 MG tablet Commonly known as:  ZOFRAN Take 1 tablet (4 mg total) by mouth every 8 (eight) hours as needed for nausea or vomiting.   sucralfate 1 g tablet Commonly known as:  CARAFATE Take 1 tablet (1 g total) by mouth 4 (four) times daily.      Follow-up Information    Herbert Pun, MD Follow up in 2 week(s).   Specialty:  General Surgery Contact information: [address] [place] [address] [phone]

## 2019-08-13 ENCOUNTER — Other Ambulatory Visit: Payer: Self-pay

## 2019-08-15 ENCOUNTER — Ambulatory Visit: Admit: 2019-08-15 | Payer: BLUE CROSS/BLUE SHIELD | Admitting: Internal Medicine

## 2019-08-15 SURGERY — COLONOSCOPY WITH PROPOFOL
Anesthesia: General

## 2020-01-13 ENCOUNTER — Other Ambulatory Visit: Payer: Self-pay | Admitting: Obstetrics and Gynecology

## 2020-01-13 DIAGNOSIS — Z1231 Encounter for screening mammogram for malignant neoplasm of breast: Secondary | ICD-10-CM

## 2020-01-30 ENCOUNTER — Ambulatory Visit
Admission: RE | Admit: 2020-01-30 | Discharge: 2020-01-30 | Disposition: A | Discharge status: Home / Self Care | Payer: BC Managed Care – PPO | Source: Ambulatory Visit | Attending: Obstetrics and Gynecology | Admitting: Obstetrics and Gynecology

## 2020-01-30 ENCOUNTER — Other Ambulatory Visit: Payer: Self-pay

## 2020-01-30 DIAGNOSIS — Z1231 Encounter for screening mammogram for malignant neoplasm of breast: Secondary | ICD-10-CM

## 2020-02-06 ENCOUNTER — Other Ambulatory Visit: Payer: Self-pay | Admitting: Obstetrics and Gynecology

## 2020-02-06 DIAGNOSIS — R928 Other abnormal and inconclusive findings on diagnostic imaging of breast: Secondary | ICD-10-CM

## 2020-02-06 DIAGNOSIS — N6320 Unspecified lump in the left breast, unspecified quadrant: Secondary | ICD-10-CM

## 2020-03-26 ENCOUNTER — Other Ambulatory Visit: Payer: Self-pay

## 2020-03-26 ENCOUNTER — Ambulatory Visit
Admission: RE | Admit: 2020-03-26 | Discharge: 2020-03-26 | Disposition: A | Discharge status: Home / Self Care | Payer: BC Managed Care – PPO | Source: Ambulatory Visit | Attending: Obstetrics and Gynecology | Admitting: Obstetrics and Gynecology

## 2020-03-26 DIAGNOSIS — R928 Other abnormal and inconclusive findings on diagnostic imaging of breast: Secondary | ICD-10-CM | POA: Insufficient documentation

## 2020-03-26 DIAGNOSIS — N6320 Unspecified lump in the left breast, unspecified quadrant: Secondary | ICD-10-CM

## 2020-08-04 ENCOUNTER — Emergency Department
Admission: EM | Admit: 2020-08-04 | Discharge: 2020-08-04 | Disposition: A | Discharge status: Home / Self Care | Payer: BC Managed Care – PPO | Attending: Student in an Organized Health Care Education/Training Program | Admitting: Student in an Organized Health Care Education/Training Program

## 2020-08-04 ENCOUNTER — Other Ambulatory Visit: Payer: Self-pay

## 2020-08-04 ENCOUNTER — Emergency Department: Payer: BC Managed Care – PPO

## 2020-08-04 DIAGNOSIS — R112 Nausea with vomiting, unspecified: Secondary | ICD-10-CM | POA: Diagnosis not present

## 2020-08-04 DIAGNOSIS — Z20822 Contact with and (suspected) exposure to covid-19: Secondary | ICD-10-CM | POA: Diagnosis not present

## 2020-08-04 DIAGNOSIS — K5900 Constipation, unspecified: Secondary | ICD-10-CM

## 2020-08-04 DIAGNOSIS — R1084 Generalized abdominal pain: Secondary | ICD-10-CM | POA: Diagnosis present

## 2020-08-04 HISTORY — DX: Anemia, unspecified: D64.9

## 2020-08-04 LAB — COMPREHENSIVE METABOLIC PANEL
ALT: 16 U/L (ref 0–44)
AST: 32 U/L (ref 15–41)
Albumin: 4.8 g/dL (ref 3.5–5.0)
Alkaline Phosphatase: 98 U/L (ref 38–126)
Anion gap: 11 (ref 5–15)
BUN: 18 mg/dL (ref 6–20)
CO2: 23 mmol/L (ref 22–32)
Calcium: 9.5 mg/dL (ref 8.9–10.3)
Chloride: 105 mmol/L (ref 98–111)
Creatinine, Ser: 0.95 mg/dL (ref 0.44–1.00)
GFR, Estimated: >60 mL/min (ref >60)
Glucose, Bld: 130 mg/dL — ABNORMAL HIGH (ref 70–99)
Potassium: 3.7 mmol/L (ref 3.5–5.1)
Sodium: 139 mmol/L (ref 135–145)
Total Bilirubin: 0.6 mg/dL (ref 0.3–1.2)
Total Protein: 7.7 g/dL (ref 6.5–8.1)

## 2020-08-04 LAB — CBC
HCT: 33.1 % — ABNORMAL LOW (ref 36.0–46.0)
Hemoglobin: 10.4 g/dL — ABNORMAL LOW (ref 12.0–15.0)
MCH: 25.3 pg — ABNORMAL LOW (ref 26.0–34.0)
MCHC: 31.4 g/dL (ref 30.0–36.0)
MCV: 80.5 fL (ref 80.0–100.0)
Platelets: 369 10*3/uL (ref 150–400)
RBC: 4.11 MIL/uL (ref 3.87–5.11)
RDW: 15.6 % — ABNORMAL HIGH (ref 11.5–15.5)
WBC: 16.8 10*3/uL — ABNORMAL HIGH (ref 4.0–10.5)
nRBC: 0 % (ref 0.0–0.2)

## 2020-08-04 LAB — LIPASE, BLOOD: Lipase: 43 U/L (ref 11–51)

## 2020-08-04 LAB — RESP PANEL BY RT-PCR (FLU A&B, COVID) ARPGX2
Influenza A by PCR: NEGATIVE
Influenza B by PCR: NEGATIVE
SARS Coronavirus 2 by RT PCR: NEGATIVE

## 2020-08-04 MED ORDER — HYDROMORPHONE HCL 1 MG/ML IJ SOLN
0.5000 mg | INTRAMUSCULAR | Status: DC | PRN
  Filled 2020-08-04: qty 1

## 2020-08-04 MED ORDER — DOCUSATE SODIUM 100 MG PO CAPS: 100.0000 mg | ORAL_CAPSULE | Freq: Two times a day (BID) | ORAL | 0 refills | Status: AC

## 2020-08-04 MED ORDER — SODIUM CHLORIDE 0.9 % IV BOLUS
1000.0000 mL | Freq: Once | INTRAVENOUS | Status: AC
  Administered 2020-08-04: 1000 mL via INTRAVENOUS

## 2020-08-04 MED ORDER — ONDANSETRON HCL 4 MG/2ML IJ SOLN
4.0000 mg | Freq: Once | INTRAMUSCULAR | Status: AC
  Administered 2020-08-04: 4 mg via INTRAVENOUS
  Filled 2020-08-04: qty 2

## 2020-08-04 MED ORDER — IOHEXOL 300 MG/ML  SOLN
100.0000 mL | Freq: Once | INTRAMUSCULAR | Status: AC | PRN
  Administered 2020-08-04: 100 mL via INTRAVENOUS

## 2020-08-04 NOTE — ED Provider Notes (Signed)
[facility] Emergency Department Provider Note    Event Date/Time   First MD Initiated Contact with Patient 08/04/20 1752     (approximate)  I have reviewed the triage vital signs and the nursing notes.   HISTORY  Chief Complaint Abdominal Pain    HPI Nicole Walsh is a 60 y.o. female below listed past medical history with recent admission to the hospital for small bowel obstruction presents to the ER for evaluation of 24 hours of generalized abdominal pain nausea vomiting and not tolerating p.o.  Feels it is identical to when she had small bowel obstruction.  No measured fevers has had some chills.  Has not had a bowel movement today.  Denies any chest pain or pressure.    Past Medical History:  Diagnosis Date  . Anemia   . Renal disorder    Family History  Problem Relation Age of Onset  . Breast cancer Neg Hx    Past Surgical History:  Procedure Laterality Date  . CHOLECYSTECTOMY     Patient Active Problem List   Diagnosis Date Noted  . Small bowel obstruction ([place]) 07/06/2018  . SBO (small bowel obstruction) ([place]) 03/25/2018      Prior to Admission medications   Medication Sig Start Date End Date Taking? Authorizing Provider  dicyclomine (BENTYL) 20 MG tablet Take 1 tablet (20 mg total) by mouth 3 (three) times daily as needed (abdominal pain). 03/24/18   Nance Pear, MD  ondansetron (ZOFRAN) 4 MG tablet Take 1 tablet (4 mg total) by mouth every 8 (eight) hours as needed for nausea or vomiting. 03/24/18   Nance Pear, MD  sucralfate (CARAFATE) 1 g tablet Take 1 tablet (1 g total) by mouth 4 (four) times daily. 03/24/18   Nance Pear, MD    Allergies Patient has no known allergies.    Social History Social History   Tobacco Use  . Smoking status: Never Smoker  . Smokeless tobacco: Never Used  Vaping Use  . Vaping Use: Never used  Substance Use Topics  . Alcohol use: Never  . Drug use: Never    Review of  Systems Patient denies headaches, rhinorrhea, blurry vision, numbness, shortness of breath, chest pain, edema, cough, abdominal pain, nausea, vomiting, diarrhea, dysuria, fevers, rashes or hallucinations unless otherwise stated above in HPI. ____________________________________________   PHYSICAL EXAM:  VITAL SIGNS: Vitals:   08/04/20 1749 08/04/20 1800  BP: (!) 178/91 (!) 168/90  Pulse: (!) 103 (!) 103  Resp: 19   Temp: 97.8 F (36.6 C)   SpO2: 100% 100%    Constitutional: Alert and oriented.  Eyes: Conjunctivae are normal.  Head: Atraumatic. Nose: No congestion/rhinnorhea. Mouth/Throat: Mucous membranes are moist.   Neck: No stridor. Painless ROM.  Cardiovascular: Normal rate, regular rhythm. Grossly normal heart sounds.  Good peripheral circulation. Respiratory: Normal respiratory effort.  No retractions. Lungs CTAB. Gastrointestinal: Soft with diffuse ttp. No distention. No abdominal bruits. No CVA tenderness. Genitourinary:  Musculoskeletal: No lower extremity tenderness nor edema.  No joint effusions. Neurologic:  Normal speech and language. No gross focal neurologic deficits are appreciated. No facial droop Skin:  Skin is warm, dry and intact. No rash noted. Psychiatric: Mood and affect are normal. Speech and behavior are normal.  ____________________________________________   LABS (all labs ordered are listed, but only abnormal results are displayed)  Results for orders placed or performed during the hospital encounter of 08/04/20 (from the past 24 hour(s))  Lipase, blood     Status:  None   Collection Time: 08/04/20  5:48 PM  Result Value Ref Range   Lipase 43 11 - 51 U/L  Comprehensive metabolic panel     Status: Abnormal   Collection Time: 08/04/20  5:48 PM  Result Value Ref Range   Sodium 139 135 - 145 mmol/L   Potassium 3.7 3.5 - 5.1 mmol/L   Chloride 105 98 - 111 mmol/L   CO2 23 22 - 32 mmol/L   Glucose, Bld 130 (H) 70 - 99 mg/dL   BUN 18 6 - 20 mg/dL    Creatinine, Ser 0.95 0.44 - 1.00 mg/dL   Calcium 9.5 8.9 - 10.3 mg/dL   Total Protein 7.7 6.5 - 8.1 g/dL   Albumin 4.8 3.5 - 5.0 g/dL   AST 32 15 - 41 U/L   ALT 16 0 - 44 U/L   Alkaline Phosphatase 98 38 - 126 U/L   Total Bilirubin 0.6 0.3 - 1.2 mg/dL   GFR, Estimated >60 >60 mL/min   Anion gap 11 5 - 15  CBC     Status: Abnormal   Collection Time: 08/04/20  5:48 PM  Result Value Ref Range   WBC 16.8 (H) 4.0 - 10.5 K/uL   RBC 4.11 3.87 - 5.11 MIL/uL   Hemoglobin 10.4 (L) 12.0 - 15.0 g/dL   HCT 33.1 (L) 36.0 - 46.0 %   MCV 80.5 80.0 - 100.0 fL   MCH 25.3 (L) 26.0 - 34.0 pg   MCHC 31.4 30.0 - 36.0 g/dL   RDW 15.6 (H) 11.5 - 15.5 %   Platelets 369 150 - 400 K/uL   nRBC 0.0 0.0 - 0.2 %   ____________________________________________  ____________________________________________  RADIOLOGY  I personally reviewed all radiographic images ordered to evaluate for the above acute complaints and reviewed radiology reports and findings.  These findings were personally discussed with the patient.  Please see medical record for radiology report.  ____________________________________________   PROCEDURES  Procedure(s) performed:  Procedures    Critical Care performed: no ____________________________________________   INITIAL IMPRESSION / ASSESSMENT AND PLAN / ED COURSE  Pertinent labs & imaging results that were available during my care of the patient were reviewed by me and considered in my medical decision making (see chart for details).   DDX: sbo, diverticulitis, colitis, appendicitis, perforation, Pyelo, electrolyte abnormality, gastritis  SYMPHONY DEMURO is a 60 y.o. who presents to the ED with sx as described above. Patient is AFVSS in ED. Exam as above. Given current presentation have considered the above differential.  uncomfrotable appearing.  Concerning for sbo.  CT ordered. Will give IV narcotic pain medication.  Patient will be signed out to oncoming physician  pending ct.      The patient was evaluated in Emergency Department today for the symptoms described in the history of present illness. He/she was evaluated in the context of the global COVID-19 pandemic, which necessitated consideration that the patient might be at risk for infection with the SARS-CoV-2 virus that causes COVID-19. Institutional protocols and algorithms that pertain to the evaluation of patients at risk for COVID-19 are in a state of rapid change based on information released by regulatory bodies including the CDC and federal and state organizations. These policies and algorithms were followed during the patient's care in the ED.  As part of my medical decision making, I reviewed the following data within the [place] notes reviewed and incorporated, Labs reviewed, notes from prior ED visits and [place] Controlled  Substance Database   ____________________________________________   FINAL CLINICAL IMPRESSION(S) / ED DIAGNOSES  Final diagnoses:  Generalized abdominal pain      NEW MEDICATIONS STARTED DURING THIS VISIT:  New Prescriptions   No medications on file     Note:  This document was prepared using Dragon voice recognition software and may include unintentional dictation errors.    Merlyn Lot, MD 08/04/20 [phone]

## 2020-08-04 NOTE — ED Provider Notes (Addendum)
-----------------------------------------   8:03 PM on 08/04/2020 -----------------------------------------  Patient care assumed from Dr. Quentin Cornwall.  Patient's labs have resulted largely within normal limits besides a moderate leukocytosis.  Patient states sensation of needing to have a bowel movement without great success.  States pain is mostly with sensation to have a bowel movement.  CT scan shows a fluid-filled distended colon with retained stool in the rectal vault.  Patient states she did have a small bowel movement after her CT scan.  Rectal examination performed by myself shows no hard stool in the rectal vault.  We will use an enema and reassess for bowel movements.  Reassuringly CT shows no sign of small bowel obstruction.  Patient had loose stool/water output after enema.  Given no impaction on my examination and no sign of small bowel obstruction I believe the patient is safe for discharge home.  I did recommend continued Colace twice daily until the patient has multiple large bowel movements.  Also discussed return precautions for any worsening pain or development of fever.  Patient agreeable to plan of care.  Urinalysis remains pending.  Patient states she does not need to urinate, urinated while having a bowel movement.  Denies any urinary symptoms and wishes to go home.  Patient states she will follow-up with her doctor.      Harvest Dark, MD 08/04/20 2146

## 2020-08-04 NOTE — ED Notes (Signed)
Patient oxygen saturation began to drop. Placed patient on 2L [employer]. Sats remained stable.

## 2020-08-04 NOTE — Discharge Instructions (Addendum)
As we discussed please take your Colace twice daily until you begin having multiple bowel movements.  If your abdominal pain worsens or you develop a fever or begin vomiting please return to the emergency department immediately for further evaluation.  Otherwise please follow-up with your doctor in the next several days for recheck/reevaluation.

## 2020-08-04 NOTE — ED Triage Notes (Addendum)
Pt comes via POV from home with c/o abdominal pain. Pt states severe pain and swelling for over a week. Pt states some vomiting.  Pt states hx of being hospitalized and having fluid drained.  Pt moaning in pain.

## 2020-10-15 ENCOUNTER — Other Ambulatory Visit: Admission: RE | Admit: 2020-10-15 | Payer: BC Managed Care – PPO | Source: Ambulatory Visit

## 2020-10-19 ENCOUNTER — Encounter: Admission: RE | Payer: Self-pay | Source: Ambulatory Visit

## 2020-10-19 ENCOUNTER — Ambulatory Visit: Admission: RE | Admit: 2020-10-19 | Payer: BC Managed Care – PPO | Source: Ambulatory Visit

## 2020-10-19 SURGERY — ESOPHAGOGASTRODUODENOSCOPY (EGD) WITH PROPOFOL
Anesthesia: General

## 2021-09-17 IMAGING — CT CT ABD-PELV W/ CM
2 of 5 series · 16 of 46 positions shown, 18 images · IV contrast (omnipaque)
Comparison: 07/06/2018

CLINICAL DATA: Abdominal pain, vomiting

EXAM:
CT ABDOMEN AND PELVIS WITH CONTRAST
TECHNIQUE: Multidetector CT imaging of the abdomen and pelvis was performed
using the standard protocol following bolus administration of
intravenous contrast.
CONTRAST:  100mL OMNIPAQUE IOHEXOL 300 MG/ML  SOLN

[Series 2: routine abd/pel with · axial · 0.87mm/px · z∈[-706,-276]mm · 13 of 98 slices shown, 15 images]
[im 6/98  soft-tissue]
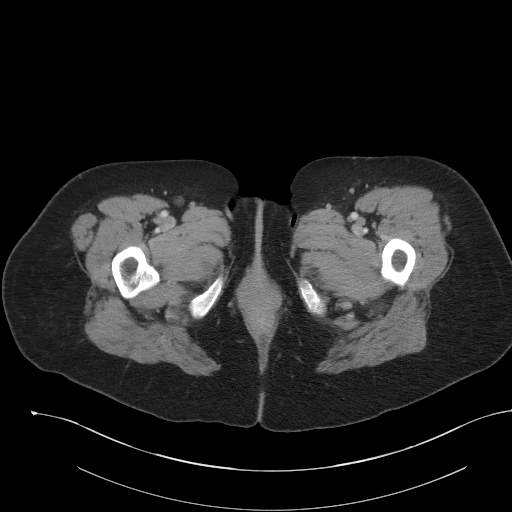
[im 6/98  bone]
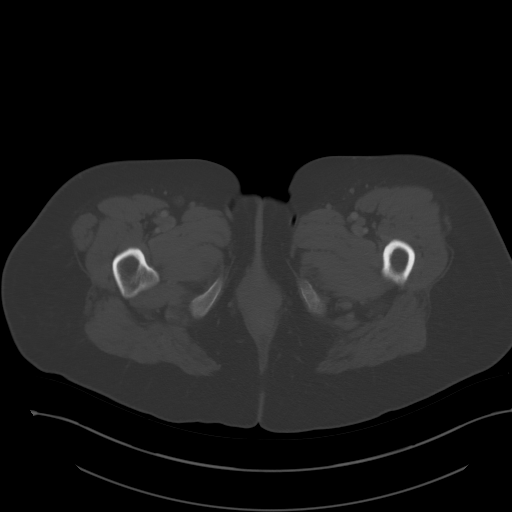
[im 16/98  soft-tissue]
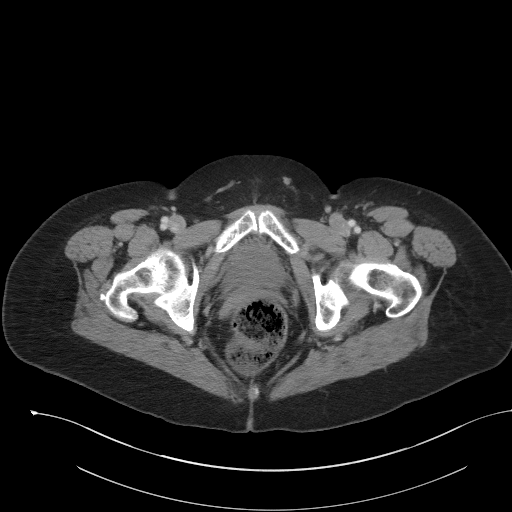
[im 21/98  soft-tissue]
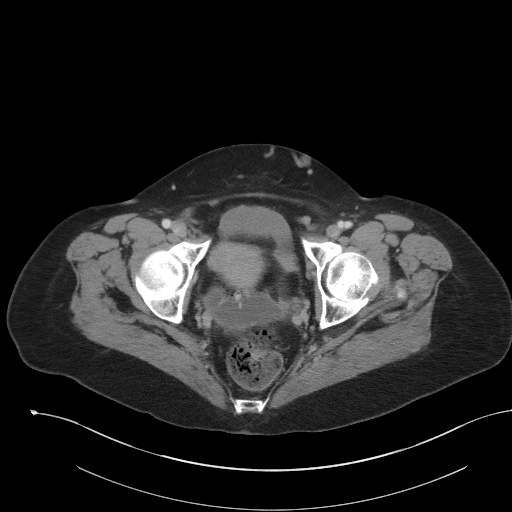
[im 26/98  soft-tissue]
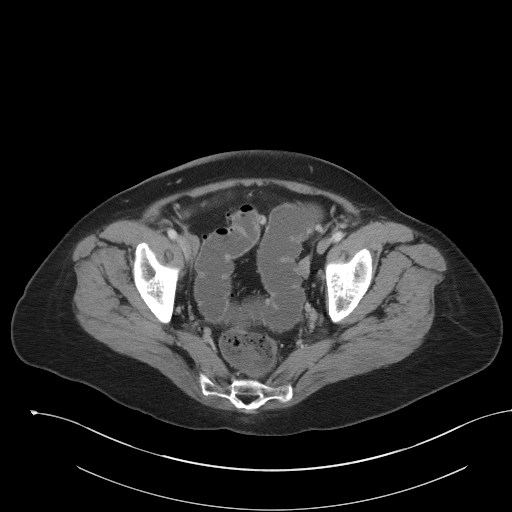
[im 36/98  soft-tissue]
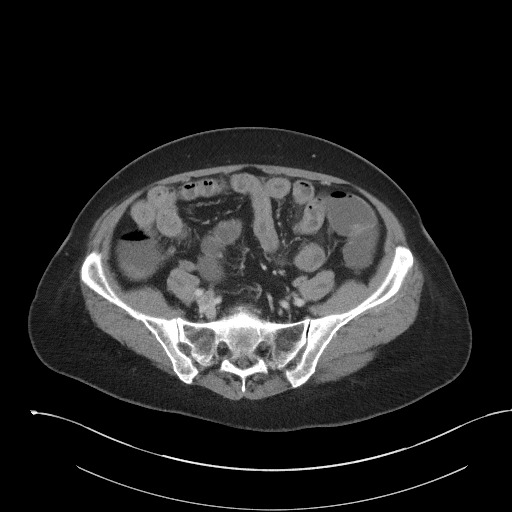
[im 41/98  soft-tissue]
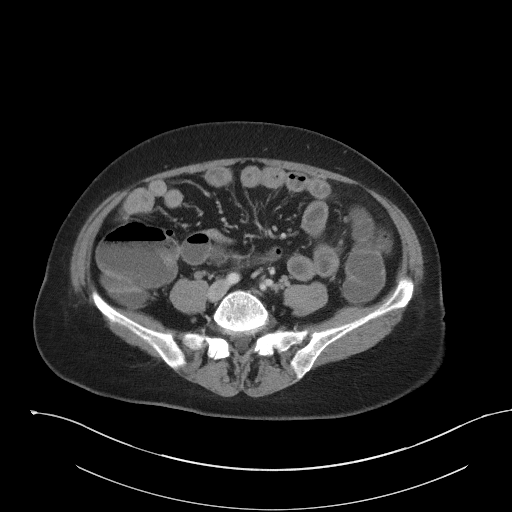
[im 52/98  soft-tissue]
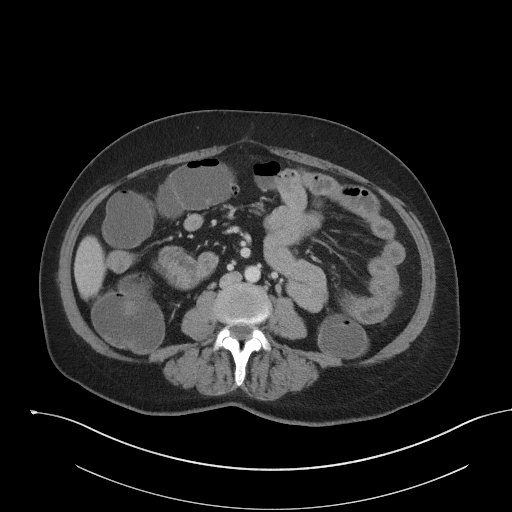
[im 57/98  soft-tissue]
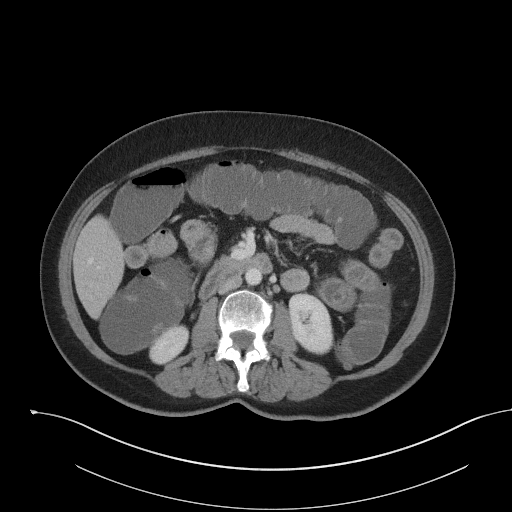
[im 62/98  soft-tissue]
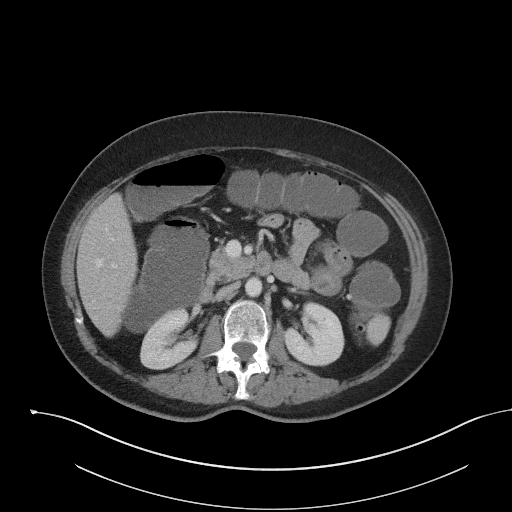
[im 62/98  bone]
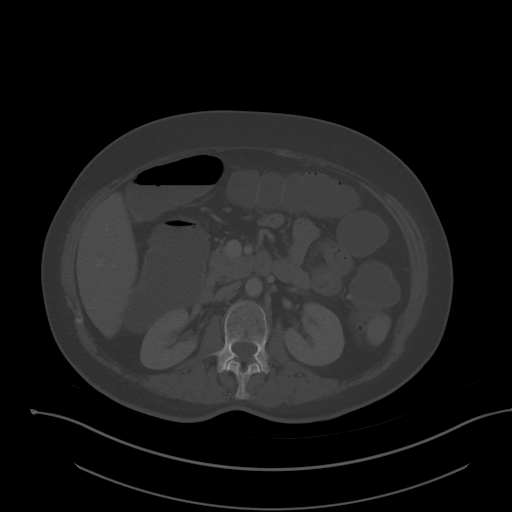
[im 72/98  soft-tissue]
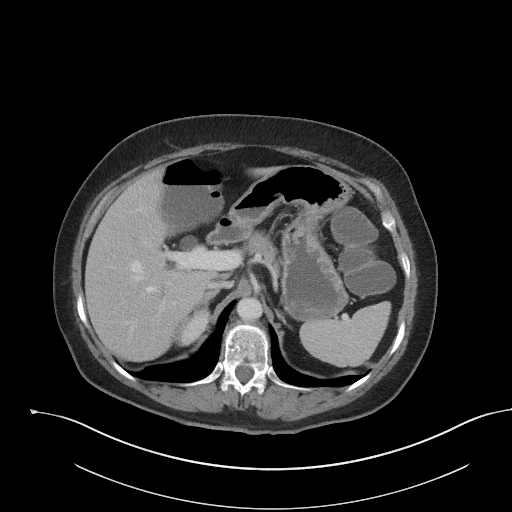
[im 77/98  soft-tissue]
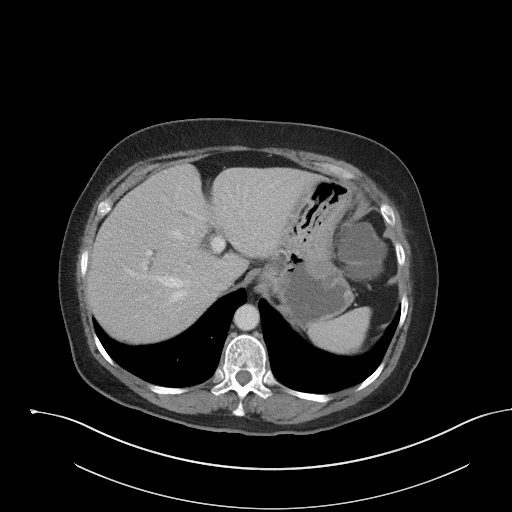
[im 82/98  soft-tissue]
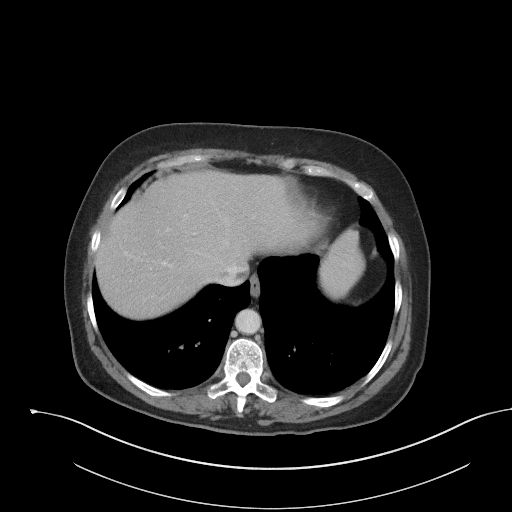
[im 92/98  soft-tissue]
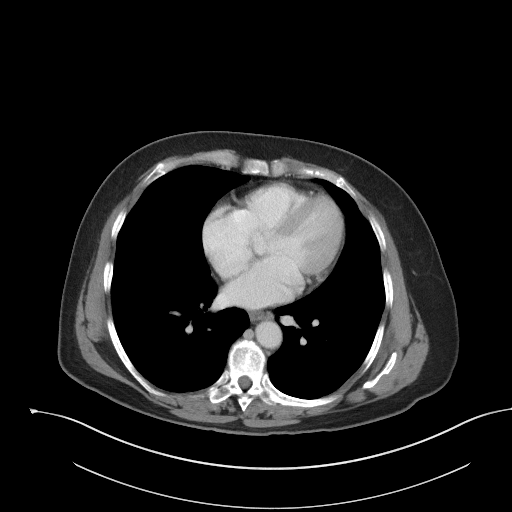

[Series 5: coronal st · coronal · 0.89mm/px · 3 of 92 slices shown]
[im 31/92  soft-tissue]
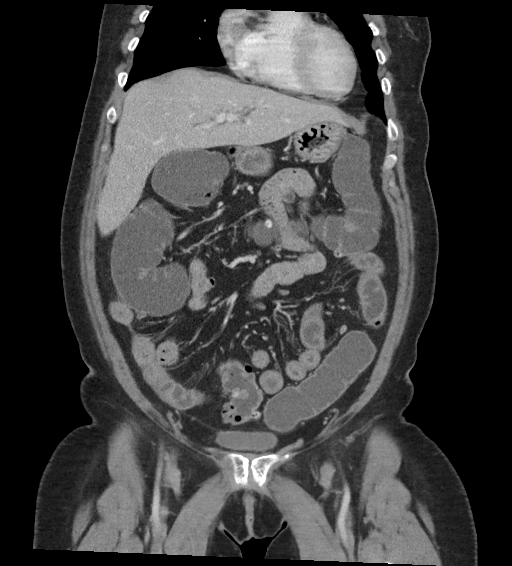
[im 41/92  soft-tissue]
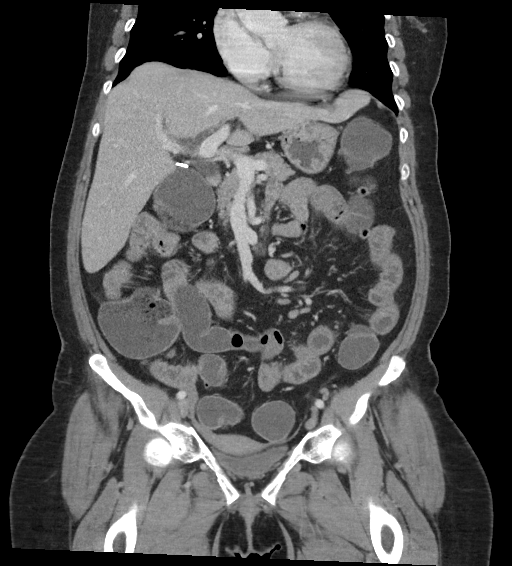
[im 51/92  soft-tissue]
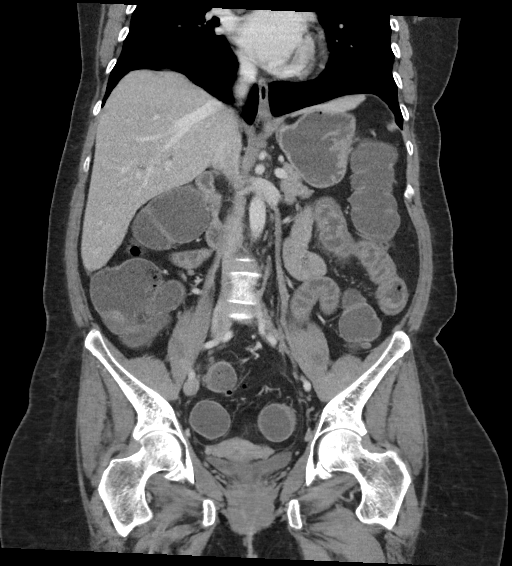

[16 of 46 positions shown; findings below may reference images not displayed]

FINDINGS: Lower chest: No acute pleural or parenchymal lung disease.

Hepatobiliary: No focal liver abnormality is seen. Status post
cholecystectomy. No biliary dilatation.

Pancreas: Unremarkable. No pancreatic ductal dilatation or
surrounding inflammatory changes.

Spleen: Normal in size without focal abnormality.

Adrenals/Urinary Tract: Adrenal glands are unremarkable. Kidneys are
normal, without renal calculi, focal lesion, or hydronephrosis.
Bladder is unremarkable.

Stomach/Bowel: There is no evidence of bowel obstruction or ileus.
The colon is mildly distended and fluid-filled, with numerous gas
fluid levels. There is retained stool within the rectal vault, and
this could reflect an element of fecal impaction. Diffuse
diverticulosis of the colon without evidence of diverticulitis. No
bowel wall thickening or inflammatory change. Normal appendix right
lower quadrant.

Vascular/Lymphatic: No significant vascular findings are present. No
enlarged abdominal or pelvic lymph nodes.

Reproductive: Uterus and bilateral adnexa are unremarkable.

Other: No free fluid or free gas.  No abdominal wall hernia.

Musculoskeletal: No acute or destructive bony lesions. Reconstructed
images demonstrate no additional findings.
IMPRESSION: 1. Fluid-filled distended colon, with retained stool in the rectal
vault likely representing an element of fecal impaction.
2. Colonic diverticulosis without diverticulitis.

## 2021-11-12 ENCOUNTER — Encounter: Payer: Self-pay | Admitting: *Deleted

## 2021-11-15 ENCOUNTER — Ambulatory Visit: Payer: BC Managed Care – PPO | Admitting: Certified Registered"

## 2021-11-15 ENCOUNTER — Ambulatory Visit
Admission: RE | Admit: 2021-11-15 | Discharge: 2021-11-15 | Disposition: A | Discharge status: Home / Self Care | Payer: BC Managed Care – PPO | Attending: Gastroenterology | Admitting: Gastroenterology

## 2021-11-15 ENCOUNTER — Encounter
Admission: RE | Disposition: A | Discharge status: Home / Self Care | Payer: Self-pay | Source: Home / Self Care | Attending: Gastroenterology

## 2021-11-15 DIAGNOSIS — K2970 Gastritis, unspecified, without bleeding: Secondary | ICD-10-CM | POA: Insufficient documentation

## 2021-11-15 DIAGNOSIS — D759 Disease of blood and blood-forming organs, unspecified: Secondary | ICD-10-CM | POA: Insufficient documentation

## 2021-11-15 DIAGNOSIS — K640 First degree hemorrhoids: Secondary | ICD-10-CM | POA: Insufficient documentation

## 2021-11-15 DIAGNOSIS — K5730 Diverticulosis of large intestine without perforation or abscess without bleeding: Secondary | ICD-10-CM | POA: Insufficient documentation

## 2021-11-15 DIAGNOSIS — D649 Anemia, unspecified: Secondary | ICD-10-CM | POA: Insufficient documentation

## 2021-11-15 DIAGNOSIS — D122 Benign neoplasm of ascending colon: Secondary | ICD-10-CM | POA: Diagnosis not present

## 2021-11-15 DIAGNOSIS — D509 Iron deficiency anemia, unspecified: Secondary | ICD-10-CM | POA: Diagnosis present

## 2021-11-15 HISTORY — PX: COLONOSCOPY WITH PROPOFOL: SHX5780

## 2021-11-15 HISTORY — DX: Essential (primary) hypertension: I10

## 2021-11-15 HISTORY — PX: ESOPHAGOGASTRODUODENOSCOPY (EGD) WITH PROPOFOL: SHX5813

## 2021-11-15 SURGERY — COLONOSCOPY WITH PROPOFOL
Anesthesia: General

## 2021-11-15 MED ORDER — PROPOFOL 10 MG/ML IV BOLUS
INTRAVENOUS | Status: DC | PRN
  Administered 2021-11-15 (×2): 50 mg via INTRAVENOUS
  Administered 2021-11-15: 20 mg via INTRAVENOUS
  Administered 2021-11-15: 30 mg via INTRAVENOUS

## 2021-11-15 MED ORDER — LIDOCAINE HCL (CARDIAC) PF 100 MG/5ML IV SOSY
PREFILLED_SYRINGE | INTRAVENOUS | Status: DC | PRN
  Administered 2021-11-15: 100 mg via INTRAVENOUS

## 2021-11-15 MED ORDER — SODIUM CHLORIDE 0.9 % IV SOLN
INTRAVENOUS | Status: DC
  Administered 2021-11-15: 20 mL/h via INTRAVENOUS

## 2021-11-15 MED ORDER — PROPOFOL 500 MG/50ML IV EMUL
INTRAVENOUS | Status: DC | PRN
  Administered 2021-11-15: 145 ug/kg/min via INTRAVENOUS

## 2021-11-15 NOTE — Op Note (Signed)
[facility] Gastroenterology Patient Name: Nicole Walsh Procedure Date: 11/15/2021 11:03 AM MRN: [id number] Account #: [id number] Date of Birth: Jan 16, 1961 Admit Type: Outpatient Age: 61 Room: [facility] ENDO ROOM 3 Gender: Female Note Status: Finalized Instrument Name: Michaelle Birks [id number] Procedure:             Upper GI endoscopy Indications:           Iron deficiency anemia Providers:             Andrey Farmer MD, MD Medicines:             Monitored Anesthesia Care Complications:         No immediate complications. Estimated blood loss:                         Minimal. Procedure:             Pre-Anesthesia Assessment:                        - Prior to the procedure, a History and Physical was                         performed, and patient medications and allergies were                         reviewed. The patient is competent. The risks and                         benefits of the procedure and the sedation options and                         risks were discussed with the patient. All questions                         were answered and informed consent was obtained.                         Patient identification and proposed procedure were                         verified by the physician, the nurse, the                         anesthesiologist, the anesthetist and the technician                         in the endoscopy suite. Mental Status Examination:                         alert and oriented. Airway Examination: normal                         oropharyngeal airway and neck mobility. Respiratory                         Examination: clear to auscultation. CV Examination:                         normal. Prophylactic Antibiotics: The patient does not  require prophylactic antibiotics. Prior                         Anticoagulants: The patient has taken no previous                         anticoagulant or antiplatelet agents. ASA Grade                          Assessment: II - A patient with mild systemic disease.                         After reviewing the risks and benefits, the patient                         was deemed in satisfactory condition to undergo the                         procedure. The anesthesia plan was to use monitored                         anesthesia care (MAC). Immediately prior to                         administration of medications, the patient was                         re-assessed for adequacy to receive sedatives. The                         heart rate, respiratory rate, oxygen saturations,                         blood pressure, adequacy of pulmonary ventilation, and                         response to care were monitored throughout the                         procedure. The physical status of the patient was                         re-assessed after the procedure.                        After obtaining informed consent, the endoscope was                         passed under direct vision. Throughout the procedure,                         the patient's blood pressure, pulse, and oxygen                         saturations were monitored continuously. The Endoscope                         was introduced through the mouth, and advanced to the  second part of duodenum. The upper GI endoscopy was                         accomplished without difficulty. The patient tolerated                         the procedure well. Findings:      The examined esophagus was normal.      Patchy minimal inflammation characterized by erythema was found in the       gastric antrum. Biopsies were taken with a cold forceps for Helicobacter       pylori testing. Estimated blood loss was minimal.      The examined duodenum was normal. Impression:            - Normal esophagus.                        - Gastritis. Biopsied.                        - Normal examined duodenum. Recommendation:        -  Await pathology results.                        - Perform a colonoscopy today. Procedure Code(s):     --- Professional ---                        [phone], Esophagogastroduodenoscopy, flexible,                         transoral; with biopsy, single or multiple Diagnosis Code(s):     --- Professional ---                        K29.70, Gastritis, unspecified, without bleeding                        D50.9, Iron deficiency anemia, unspecified CPT copyright 2019 American Medical Association. All rights reserved. The codes documented in this report are preliminary and upon coder review may  be revised to meet current compliance requirements. Andrey Farmer MD, MD 11/15/2021 11:39:03 AM Number of Addenda: 0 Note Initiated On: 11/15/2021 11:03 AM Estimated Blood Loss:  Estimated blood loss was minimal.      [facility]

## 2021-11-15 NOTE — Interval H&P Note (Signed)
History and Physical Interval Note:  11/15/2021 10:58 AM  Nicole Walsh  has presented today for surgery, with the diagnosis of IDA.  The various methods of treatment have been discussed with the patient and family. After consideration of risks, benefits and other options for treatment, the patient has consented to  Procedure(s) with comments: COLONOSCOPY WITH PROPOFOL (N/A) - SPANISH INTERPRETER ESOPHAGOGASTRODUODENOSCOPY (EGD) WITH PROPOFOL (N/A) as a surgical intervention.  The patient's history has been reviewed, patient examined, no change in status, stable for surgery.  I have reviewed the patient's chart and labs.  Questions were answered to the patient's satisfaction.     Lesly Rubenstein  Ok to proceed with EGD/Colonoscopy

## 2021-11-15 NOTE — Anesthesia Preprocedure Evaluation (Signed)
Anesthesia Evaluation  Patient identified by MRN, date of birth, ID band Patient awake    Reviewed: Allergy & Precautions, NPO status , Patient's Chart, lab work & pertinent test results  Airway Mallampati: III  TM Distance: >3 FB Neck ROM: Full    Dental  (+) Teeth Intact   Pulmonary neg pulmonary ROS,    Pulmonary exam normal breath sounds clear to auscultation       Cardiovascular Exercise Tolerance: Good hypertension, Pt. on medications negative cardio ROS Normal cardiovascular exam Rhythm:Regular     Neuro/Psych negative neurological ROS  negative psych ROS   GI/Hepatic negative GI ROS, Neg liver ROS,   Endo/Other  negative endocrine ROS  Renal/GU negative Renal ROS  negative genitourinary   Musculoskeletal   Abdominal Normal abdominal exam  (+)   Peds negative pediatric ROS (+)  Hematology negative hematology ROS (+) Blood dyscrasia, anemia ,   Anesthesia Other Findings Past Medical History: No date: Anemia No date: Hypertension No date: Renal disorder  Past Surgical History: 2005: CHOLECYSTECTOMY  BMI    Body Mass Index: 28.62 kg/m      Reproductive/Obstetrics negative OB ROS                             Anesthesia Physical Anesthesia Plan  ASA: 2  Anesthesia Plan: General   Post-op Pain Management:    Induction:   PONV Risk Score and Plan: Propofol infusion and TIVA  Airway Management Planned:   Additional Equipment:   Intra-op Plan:   Post-operative Plan:   Informed Consent: I have reviewed the patients History and Physical, chart, labs and discussed the procedure including the risks, benefits and alternatives for the proposed anesthesia with the patient or authorized representative who has indicated his/her understanding and acceptance.     Dental Advisory Given  Plan Discussed with: CRNA and Surgeon  Anesthesia Plan Comments:          Anesthesia Quick Evaluation

## 2021-11-15 NOTE — Anesthesia Postprocedure Evaluation (Signed)
Anesthesia Post Note  Patient: Nicole Walsh  Procedure(s) Performed: COLONOSCOPY WITH PROPOFOL ESOPHAGOGASTRODUODENOSCOPY (EGD) WITH PROPOFOL  Patient location during evaluation: PACU Anesthesia Type: General Level of consciousness: awake and awake and alert Pain management: satisfactory to patient Respiratory status: respiratory function stable and nonlabored ventilation Cardiovascular status: stable Anesthetic complications: no   No notable events documented.   Last Vitals:  Vitals:   11/15/21 1149 11/15/21 1200  BP: 111/75   Pulse: 78   Resp: 18   Temp:    SpO2: 100% 100%    Last Pain:  Vitals:   11/15/21 1149  TempSrc:   PainSc: 0-No pain                 VAN STAVEREN,Ryonna Cimini

## 2021-11-15 NOTE — Op Note (Signed)
[facility] Gastroenterology Patient Name: Nicole Walsh Procedure Date: 11/15/2021 11:02 AM MRN: [id number] Account #: [id number] Date of Birth: 03-09-1961 Admit Type: Outpatient Age: 61 Room: [facility] ENDO ROOM 3 Gender: Female Note Status: Finalized Instrument Name: Jasper Riling [id number] Procedure:             Colonoscopy Indications:           Iron deficiency anemia Providers:             Andrey Farmer MD, MD Referring MD:          No Local Md, MD (Referring MD) Medicines:             Monitored Anesthesia Care Complications:         No immediate complications. Estimated blood loss:                         Minimal. Procedure:             Pre-Anesthesia Assessment:                        - Prior to the procedure, a History and Physical was                         performed, and patient medications and allergies were                         reviewed. The patient is competent. The risks and                         benefits of the procedure and the sedation options and                         risks were discussed with the patient. All questions                         were answered and informed consent was obtained.                         Patient identification and proposed procedure were                         verified by the physician, the nurse, the                         anesthesiologist, the anesthetist and the technician                         in the endoscopy suite. Mental Status Examination:                         alert and oriented. Airway Examination: normal                         oropharyngeal airway and neck mobility. Respiratory                         Examination: clear to auscultation. CV Examination:  normal. Prophylactic Antibiotics: The patient does not                         require prophylactic antibiotics. Prior                         Anticoagulants: The patient has taken no previous                          anticoagulant or antiplatelet agents. ASA Grade                         Assessment: II - A patient with mild systemic disease.                         After reviewing the risks and benefits, the patient                         was deemed in satisfactory condition to undergo the                         procedure. The anesthesia plan was to use monitored                         anesthesia care (MAC). Immediately prior to                         administration of medications, the patient was                         re-assessed for adequacy to receive sedatives. The                         heart rate, respiratory rate, oxygen saturations,                         blood pressure, adequacy of pulmonary ventilation, and                         response to care were monitored throughout the                         procedure. The physical status of the patient was                         re-assessed after the procedure.                        After obtaining informed consent, the colonoscope was                         passed under direct vision. Throughout the procedure,                         the patient's blood pressure, pulse, and oxygen                         saturations were monitored continuously. The  Colonoscope was introduced through the anus and                         advanced to the the terminal ileum. The colonoscopy                         was performed without difficulty. The patient                         tolerated the procedure well. The quality of the bowel                         preparation was good. Findings:      The perianal and digital rectal examinations were normal.      The terminal ileum appeared normal.      Two sessile polyps were found in the ascending colon. The polyps were 5       mm in size. These polyps were removed with a cold snare. Resection and       retrieval were complete. Estimated blood loss was minimal.      Multiple  small-mouthed diverticula were found in the sigmoid colon,       descending colon, transverse colon and ascending colon.      Internal hemorrhoids were found during retroflexion. The hemorrhoids       were Grade I (internal hemorrhoids that do not prolapse).      The exam was otherwise without abnormality on direct and retroflexion       views. Impression:            - The examined portion of the ileum was normal.                        - Two 5 mm polyps in the ascending colon, removed with                         a cold snare. Resected and retrieved.                        - Diverticulosis in the sigmoid colon, in the                         descending colon, in the transverse colon and in the                         ascending colon.                        - Internal hemorrhoids.                        - The examination was otherwise normal on direct and                         retroflexion views. Recommendation:        - Discharge patient to home.                        - Resume previous diet.                        -  Continue present medications.                        - Await pathology results.                        - Repeat colonoscopy date to be determined after                         pending pathology results are reviewed for                         surveillance.                        - Return to referring physician as previously                         scheduled. Procedure Code(s):     --- Professional ---                        [phone], Colonoscopy, flexible; with removal of                         tumor(s), polyp(s), or other lesion(s) by snare                         technique Diagnosis Code(s):     --- Professional ---                        K64.0, First degree hemorrhoids                        K63.5, Polyp of colon                        D50.9, Iron deficiency anemia, unspecified                        K57.30, Diverticulosis of large intestine without                          perforation or abscess without bleeding CPT copyright 2019 American Medical Association. All rights reserved. The codes documented in this report are preliminary and upon coder review may  be revised to meet current compliance requirements. Andrey Farmer MD, MD 11/15/2021 11:43:30 AM Number of Addenda: 0 Note Initiated On: 11/15/2021 11:02 AM Scope Withdrawal Time: 0 hours 9 minutes 57 seconds  Total Procedure Duration: 0 hours 17 minutes 52 seconds  Estimated Blood Loss:  Estimated blood loss was minimal.      [facility]

## 2021-11-15 NOTE — Transfer of Care (Signed)
Immediate Anesthesia Transfer of Care Note  Patient: Nicole Walsh  Procedure(s) Performed: COLONOSCOPY WITH PROPOFOL ESOPHAGOGASTRODUODENOSCOPY (EGD) WITH PROPOFOL  Patient Location: Endoscopy Unit  Anesthesia Type:General  Level of Consciousness: awake, drowsy and patient cooperative  Airway & Oxygen Therapy: Patient Spontanous Breathing and Patient connected to face mask oxygen  Post-op Assessment: Report given to RN and Post -op Vital signs reviewed and stable  Post vital signs: Reviewed and stable  Last Vitals:  Vitals Value Taken Time  BP 98/63 11/15/21 1139  Temp 35.6 C 11/15/21 1139  Pulse 78 11/15/21 1141  Resp 12 11/15/21 1141  SpO2 100 % 11/15/21 1141  Vitals shown include unvalidated device data.  Last Pain:  Vitals:   11/15/21 1139  TempSrc: Tympanic  PainSc: Asleep         Complications: No notable events documented.

## 2021-11-15 NOTE — H&P (Signed)
Outpatient short stay form Pre-procedure 11/15/2021  Lesly Rubenstein, MD  Primary Physician: [place]  Reason for visit:  IDA  History of present illness:    61 y/o lady with history of IDA and SBO here for EGD/Colonoscopy. No blood thinners. No family history of GI malignancies. History of cholecystectomy.    Current Facility-Administered Medications:    0.9 %  sodium chloride infusion, , Intravenous, Continuous, Willona Phariss, Hilton Cork, MD, Last Rate: 20 mL/hr at 11/15/21 1053, 20 mL/hr at 11/15/21 1053  Medications Prior to Admission  Medication Sig Dispense Refill Last Dose   dicyclomine (BENTYL) 20 MG tablet Take 1 tablet (20 mg total) by mouth 3 (three) times daily as needed (abdominal pain). 30 tablet 0 Past Week   docusate sodium (COLACE) 100 MG capsule Take 100 mg by mouth 2 (two) times daily.   Past Week   ondansetron (ZOFRAN) 4 MG tablet Take 1 tablet (4 mg total) by mouth every 8 (eight) hours as needed for nausea or vomiting. 20 tablet 0 Past Week   pantoprazole (PROTONIX) 40 MG tablet Take 40 mg by mouth daily.   Past Week   sucralfate (CARAFATE) 1 g tablet Take 1 tablet (1 g total) by mouth 4 (four) times daily. 60 tablet 0 Past Week     No Known Allergies   Past Medical History:  Diagnosis Date   Anemia    Hypertension    Renal disorder     Review of systems:  Otherwise negative.    Physical Exam  Gen: Alert, oriented. Appears stated age.  HEENT: PERRLA. Lungs: No respiratory distress CV: RRR Abd: soft, benign, no masses Ext: No edema    Planned procedures: Proceed with EGD/colonoscopy. The patient understands the nature of the planned procedure, indications, risks, alternatives and potential complications including but not limited to bleeding, infection, perforation, damage to internal organs and possible oversedation/side effects from anesthesia. The patient agrees and gives consent to proceed.  Please refer to procedure notes for  findings, recommendations and patient disposition/instructions.     Lesly Rubenstein, MD [facility] Gastroenterology

## 2021-11-15 NOTE — Anesthesia Procedure Notes (Signed)
Procedure Name: General with mask airway Date/Time: 11/15/2021 11:23 AM Performed by: Kelton Pillar, CRNA Pre-anesthesia Checklist: Patient identified, Emergency Drugs available, Suction available and Patient being monitored Patient Re-evaluated:Patient Re-evaluated prior to induction Oxygen Delivery Method: Simple face mask Induction Type: IV induction Placement Confirmation: positive ETCO2 and CO2 detector Dental Injury: Teeth and Oropharynx as per pre-operative assessment

## 2021-11-16 ENCOUNTER — Encounter: Payer: Self-pay | Admitting: Gastroenterology

## 2021-11-17 LAB — SURGICAL PATHOLOGY

## 2022-04-07 NOTE — Progress Notes (Signed)
 PATIENT PROFILE: Nicole Walsh is a 61 y.o. female who presents for a visit to the GI clinic at [facility]  for a visit to follow up  PROBLEM LIST:  Patient Active Problem List  Diagnosis  . Hypertension (diet controlled)  . Prediabetes (A1C 5.8 on 07/31/19)    Endoscopic History:  EGD 5/23- Dr Maryruth- normal esophagus, gastritis, normal duodenum. There was h pylori gastritis; there was no dysplasia/malignancy. She was treated with quadruple therapy Colonoscopy 5/23- Dr Maryruth- 2 small polyps- one was adenoma other benign tissue, diverticulosis, hemorrhoids. 7y repeat  INTERVAL HISTORY:  Nicole Walsh was last seen on 08/19/21 by Nicole Walsh for IDA and luw q pain- ppi started and egd/colonoscopy arranged. Hgb 10.4 and ferritin 4. Was to continue Fe  Since the last visit, states she is fairly well- states as long as she avoids spicy foods she has no abdominal pain at all and denies any further GI related concerns. States she has not been on Fe for some time and ran out of the pantoprazole so has not been taking it. States she did take all the quadruple therapy, finished it a couple months ago. We reviewed her results, and discussed her plan of care. Her questions were answered.  Medications:  Outpatient Encounter Medications as of 04/07/2022  Medication Sig Dispense Refill  . docusate (COLACE) 100 MG capsule Take 1 capsule (100 mg total) by mouth 2 (two) times daily (Patient not taking: Reported on 08/19/2021) 60 capsule 3  . pantoprazole (PROTONIX) 40 MG DR tablet Take 1 tablet (40 mg total) by mouth once daily 30 tablet 3  . sodium, potassium, and magnesium (SUPREP) oral solution Take 1 Bottle by mouth as directed One kit contains 2 bottles.  Take both bottles at the times instructed by your provider. 354 mL 0   No facility-administered encounter medications on file as of 04/07/2022.     ALLERGIES: Patient has no known allergies.   PMHx:  Past Medical History:   Diagnosis Date  . Hypertension      PSHx: Past Surgical History:  Procedure Laterality Date  . CHOLECYSTECTOMY  2005   Laparoscopic cholecystectomy/cholangiogram/bile duct exploration.  . Colon @ [facility]  11/15/2021   Tubular adenomas/Repeat 23yrs/CTL  . EGD @ [facility]  11/15/2021   Gastritis/+H.Pylori/Repeat PRN/CTL     FAMILY HISTORY: Family History  Problem Relation Age of Onset  . High blood pressure (Hypertension) Mother   . High blood pressure (Hypertension) Father   . Coronary Artery Disease (Blocked arteries around heart) Father   . Diabetes type II Neg Hx   . Colon polyps Neg Hx   . Breast cancer Neg Hx      Social History: Social History   Socioeconomic History  . Marital status: Married    Spouse name: Nicole Walsh  . Number of children: 5  Occupational History    Comment: [employer]   Tobacco Use  . Smoking status: Never  . Smokeless tobacco: Never  Vaping Use  . Vaping Use: Never used  Substance and Sexual Activity  . Alcohol use: No  . Drug use: No  . Sexual activity: Yes    Partners: Male    Birth control/protection: Post-menopausal     REVIEW OF SYSTEMS:   10 systems reviewed, unremarkable other than what is written above.    PHYSICAL EXAM:  Vitals:   04/07/22 1106  BP: (!) 170/87  Pulse: 82  Temp: 36.4 C (97.6 F)   Body mass index is 31.14 kg/m.  Weight: 79.7 kg (175 lb 12.8 oz)   General Appearance:    Alert, cooperative, no distress, appears stated age Head:     Atraumatic, normocephalic Eyes:   Anciteric, conjunctiva pink. Mouth: no oral ulcers Lungs:     Respirations eupneic.  Clear to auscultation bilaterally  Heart:    Regular rate and rhythm, S1 and S2 normal, no murmur, rub   or gallop Abdomen:     Flat, bowel sounds x 4.Soft, non-tender/nondistended. No guarding, rigidity, rebound tenderness or other peritoneal signs Extremities:   No cyanosis or edema, moves all extremities well. Strength 5/5. Skin:   Skin color, texture,  turgor normal, no rashes or lesions Neuro: alert, oriented x 3, cranial nerves II-XII intact Psych: pleasant, calm, cooperative, Logical thought and good insight.    REVIEW OF DATA: I have reviewed the following data today:  Prior notes labs endoscopy reports    ASSESSMENT AND PLAN:   IDA- recheck cbc/ferritin/iron panel H pylori gastritis- treated- check stool ag for eradication. Patient wanted to rx of pantoprazole for prn use- we discussed how to take it for effectiveness and the role of when to use tums/gaviscon, etc. Adenoma- colonoscopy 7y Follow up 1y sooner if problems Interpreter services provided by Nicole Walsh

## 2022-06-16 NOTE — Progress Notes (Signed)
 CC: Preventative Health Exam  HPI  Nicole Walsh is a 61 y.o. here for preventative health exam  Preventative health exam: No acute issues.  Chronic medical issues stable and tolerating medications without adverse effects.  Does not exercise regularly and has not been trying to adhere to healthy diet.  No exertional cp or syncopal episodes.  No vaginal symptoms, urinary issues, or rectal pain/bleeding.  Denies any tobacco use.     Thyroid levels were slightly elevated on recent labs, but she is not currently taking any thyroid medication. She denies experiencing fatigue, hair loss, heart palpitations, dry skin, constipation, or diarrhea.   She has a history of left leg pain and swelling, particularly in the thigh area, which has been ongoing for 31 years without any trauma. The pain has worsened with age and is now affecting her bones, making them weaker. She experiences pain in the front, side, and knee of her left leg. The patient wears a brace to help support her tendons and alleviate pain. She has tried Advil  and Tylenol  for pain management, but they have not been effective.  The patient has a history of iron deficiency anemia and is currently taking an iron supplement, although she is unsure of the strength. She recently underwent a capsule study through GI, but the results have not yet been provided.   ROS Review of systems is unremarkable for any active cardiac, respiratory, GI, GU, hematologic, neurologic, dermatologic, HEENT, or psychiatric symptoms except as noted above.  No fevers, chills, or constitutional symptoms.   Current Outpatient Medications: ferrous sulfate 325 (65 FE) MG EC tablet, Take 325 mg by mouth daily with breakfast, Taking pantoprazole (PROTONIX) 40 MG DR tablet, Take 1 tablet (40 mg total) by mouth once daily, Taking peg-electrolyte (NULYTELY) solution, Take 4,000 mLs by mouth as directed (Patient not taking: Reported on 06/16/2022), Not Taking  Allergies as of  06/16/2022  . (No Known Allergies)    Patient Active Problem List  Diagnosis  . Hypertension (diet controlled)  . Prediabetes (A1C 5.8 on 07/31/19)    Past Medical History:  Diagnosis Date  . Hypertension     Past Surgical History:  Procedure Laterality Date  . CHOLECYSTECTOMY  2005   Laparoscopic cholecystectomy/cholangiogram/bile duct exploration.  . Colon @ [facility]  11/15/2021   Tubular adenomas/Repeat 1yrs/CTL  . EGD @ [facility]  11/15/2021   Gastritis/+H.Pylori/Repeat PRN/CTL    Family History  Problem Relation Age of Onset  . High blood pressure (Hypertension) Mother   . High blood pressure (Hypertension) Father   . Coronary Artery Disease (Blocked arteries around heart) Father   . Diabetes type II Neg Hx   . Colon polyps Neg Hx   . Breast cancer Neg Hx     Social History   Socioeconomic History  . Marital status: Married    Spouse name: Wilfredo  . Number of children: 5  Occupational History    Comment: [employer]   Tobacco Use  . Smoking status: Never  . Smokeless tobacco: Never  Vaping Use  . Vaping Use: Never used  Substance and Sexual Activity  . Alcohol use: No  . Drug use: No  . Sexual activity: Yes    Partners: Male    Birth control/protection: Post-menopausal    Health Maintenance  Topic Date Due  . HIV Screen  Never done  . Hepatitis C Screen  Never done  . Adult Tetanus (Td And Tdap)  Never done  . Shingrix (1 of 2) Never done  .  Annual Visit/Physical/Well Child Check  01/12/2021  . Mammogram  03/26/2021  . COVID-19 Vaccine (3 - 2023-24 season) 02/25/2022  . Influenza Vaccine (1) Never done  . Pap Smear  10/24/2022  . Potassium Level  05/31/2023  . Serum Calcium  05/31/2023  . Depression Screening  06/17/2023  . Diabetes Screening  05/30/2025  . Lipid Panel  05/31/2027  . Colorectal Cancer Screening  11/15/2028  . Hib Vaccines  Aged Out  . Hepatitis A Vaccines  Aged Out  . Meningococcal ACWY Vaccine  Aged Out  . Pneumococcal Vaccine   Aged Out  . HPV Vaccines  Aged Out    Health Habits:  Dental Exam: not up to date Eye Exam: not up to date Skin Exam: not up to date Exercise: 0 times/week on average  Current exercise activities: no regular exercise  Diet: does not currently watch diet   Seat belt ldz:Jotjbd  GYN: Sexual Health  Menses: absent  Last pap smear: see HM section, 2019 History of abnormal pap smears: No  Sexually active: not currently   PHQ 2/9 from today's flowsheet PHQ-2 PHQ-2 Over the last 2 weeks, how often have you been bothered by any of the following problems? Little interest or pleasure in doing things: Not At All Feeling down, depressed, or hopeless (or irritable for Teens only)?: Not At All Total Prescreening Score: 0  PHQ-9 (if PHQ >=3) PHQ-9 Over the last 2 weeks, how often have you been bothered by any of the following problems? Total Score =: 0  Depression Severity and Treatment Recommendations:  0-4= None  5-9= Mild / Treatment: Support, educate to call if worse; return in one month  10-14= Moderate / Treatment: Support, watchful waiting; Antidepressant or Psychotherapy  15-19= Moderately severe / Treatment: Antidepressant OR Psychotherapy  >= 20 = Major depression, severe / Antidepressant AND Psychotherapy  A total of 15 minutes was spent completing the PHQ questionnaire and counseling patient.   Vitals:   06/16/22 1305  BP: 122/74  Pulse: 83  SpO2: 98%  Weight: 79.3 kg (174 lb 12.8 oz)  Height: 160 cm (5' 3)  PainSc:   5  PainLoc: Thigh    Body mass index is 30.96 kg/m.  Exam  General: Alert oriented x3  Skin: No suspicious lesions or moles.   Eyes: Sclera and conjunctiva clear; pupils equal round and reactive to light and accommodation; extraocular movements intact Ears: External ears and canal normal; tympanic membranes normal.   Nose: Mucosa healthy without drainage or ulceration Oropharynx: No suspicious lesions Neck: No swelling, masses, stiffness,  pain, limited movement, carotid pulses normal bilaterally, thyroid normal size, no masses palpated. No bruits heard. Breasts: Defer to GYN Lungs: Respirations unlabored; clear to auscultation bilaterally Back: No spinal deformity Cardiovascular: Heart regular rate and rhythm without murmurs, gallops, or rubs Abdomen: Soft; non tender; non distended;  no masses or organomegaly Lymph Nodes: No significant cervical, supraclavicular, axillary or inguinal lymphadenopathy noted Genitalia: Defer to GYN Musculoskeletal: No active joint inflammation Extremities: Normal, no edema Pulses: Dorsalis pedis palpable and symmetric bilaterally Neurologic: Alert and oriented; speech intact; face symmetrical; moves all extremities well   Appointment on 05/30/2022  Component Date Value Ref Range Status  . WBC (White Blood Cell Count) 05/30/2022 3.5 (L)  4.1 - 10.2 10^3/uL Final  . RBC (Red Blood Cell Count) 05/30/2022 4.16  4.04 - 5.48 10^6/uL Final  . Hemoglobin 05/30/2022 10.8 (L)  12.0 - 15.0 gm/dL Final  . Hematocrit 87/95/7976 35.5  35.0 - 47.0 %  Final  . MCV (Mean Corpuscular Volume) 05/30/2022 85.3  80.0 - 100.0 fl Final  . MCH (Mean Corpuscular Hemoglobin) 05/30/2022 26.0 (L)  27.0 - 31.2 pg Final  . MCHC (Mean Corpuscular Hemoglobin * 05/30/2022 30.4 (L)  32.0 - 36.0 gm/dL Final  . Platelet Count 05/30/2022 278  150 - 450 10^3/uL Final  . RDW-CV (Red Cell Distribution Widt* 05/30/2022 21.2 (H)  11.6 - 14.8 % Final  . MPV (Mean Platelet Volume) 05/30/2022 9.5  9.4 - 12.4 fl Final  . Neutrophils 05/30/2022 1.68  1.50 - 7.80 10^3/uL Final  . Lymphocytes 05/30/2022 1.35  1.00 - 3.60 10^3/uL Final  . Monocytes 05/30/2022 0.40  0.00 - 1.50 10^3/uL Final  . Eosinophils 05/30/2022 0.07  0.00 - 0.55 10^3/uL Final  . Basophils 05/30/2022 0.03  0.00 - 0.09 10^3/uL Final  . Neutrophil % 05/30/2022 47.5  32.0 - 70.0 % Final  . Lymphocyte % 05/30/2022 38.1  10.0 - 50.0 % Final  . Monocyte % 05/30/2022 11.3   4.0 - 13.0 % Final  . Eosinophil % 05/30/2022 2.0  1.0 - 5.0 % Final  . Basophil% 05/30/2022 0.8  0.0 - 2.0 % Final  . Immature Granulocyte % 05/30/2022 0.3  <=0.7 % Final  . Immature Granulocyte Count 05/30/2022 0.01  <=0.06 10^3/L Final  . Glucose 05/30/2022 87  70 - 110 mg/dL Final  . Sodium 87/95/7976 140  136 - 145 mmol/L Final  . Potassium 05/30/2022 4.3  3.6 - 5.1 mmol/L Final  . Chloride 05/30/2022 106  97 - 109 mmol/L Final  . Carbon Dioxide (CO2) 05/30/2022 27.1  22.0 - 32.0 mmol/L Final  . Urea Nitrogen (BUN) 05/30/2022 16  7 - 25 mg/dL Final  . Creatinine 87/95/7976 0.8  0.6 - 1.1 mg/dL Final  . Glomerular Filtration Rate (eGFR) 05/30/2022 83  >60 mL/min/1.73sq m Final  . Calcium 05/30/2022 9.3  8.7 - 10.3 mg/dL Final  . AST  87/95/7976 21  8 - 39 U/L Final  . ALT  05/30/2022 11  5 - 38 U/L Final  . Alk Phos (alkaline Phosphatase) 05/30/2022 100  34 - 104 U/L Final  . Albumin 05/30/2022 4.3  3.5 - 4.8 g/dL Final  . Bilirubin, Total 05/30/2022 0.5  0.3 - 1.2 mg/dL Final  . Protein, Total 05/30/2022 7.0  6.1 - 7.9 g/dL Final  . A/G Ratio 87/95/7976 1.6  1.0 - 5.0 gm/dL Final  . Hemoglobin J8R 05/30/2022 5.6  4.2 - 5.6 % Final  . Average Blood Glucose (Calc) 05/30/2022 114  mg/dL Final  . Cholesterol, Total 05/30/2022 162  100 - 200 mg/dL Final  . Triglyceride 87/95/7976 168  35 - 199 mg/dL Final  . HDL (High Density Lipoprotein) Cho* 05/30/2022 40.2  35.0 - 85.0 mg/dL Final  . LDL Calculated 05/30/2022 88  0 - 130 mg/dL Final  . VLDL Cholesterol 05/30/2022 34  mg/dL Final  . Cholesterol/HDL Ratio 05/30/2022 4.0   Final  . Thyroid Stimulating Hormone (TSH) 05/30/2022 6.729 (H)  0.450-5.330 uIU/ml uIU/mL Final  . Color 05/30/2022 Yellow  Colorless, Straw, Light Yellow, Yellow, Dark Yellow Final  . Clarity 05/30/2022 Clear  Clear Final  . Specific Gravity 05/30/2022 1.025  1.000 - 1.030 Final  . pH, Urine 05/30/2022 5.5  5.0 - 8.0 Final  . Protein, Urinalysis 05/30/2022  Negative  Negative, Trace mg/dL Final  . Glucose, Urinalysis 05/30/2022 Negative  Negative mg/dL Final  . Ketones, Urinalysis 05/30/2022 Negative  Negative mg/dL Final  . Blood, Urinalysis  05/30/2022 Negative  Negative Final  . Nitrite, Urinalysis 05/30/2022 Negative  Negative Final  . Leukocyte Esterase, Urinalysis 05/30/2022 Trace (!)  Negative Final  . White Blood Cells, Urinalysis 05/30/2022 0-3  None Seen, 0-3 /hpf Final  . Red Blood Cells, Urinalysis 05/30/2022 None Seen  None Seen, 0-3 /hpf Final  . Bacteria, Urinalysis 05/30/2022 None Seen  None Seen /hpf Final  . Squamous Epithelial Cells, Urinaly* 05/30/2022 None Seen  Rare, Few, None Seen /hpf Final  Office Visit on 04/07/2022  Component Date Value Ref Range Status  . WBC (White Blood Cell Count) 04/07/2022 4.7  4.1 - 10.2 10^3/uL Final  . RBC (Red Blood Cell Count) 04/07/2022 3.98 (L)  4.04 - 5.48 10^6/uL Final  . Hemoglobin 04/07/2022 9.0 (L)  12.0 - 15.0 gm/dL Final  . Hematocrit 89/87/7976 31.4 (L)  35.0 - 47.0 % Final  . MCV (Mean Corpuscular Volume) 04/07/2022 78.9 (L)  80.0 - 100.0 fl Final  . MCH (Mean Corpuscular Hemoglobin) 04/07/2022 22.6 (L)  27.0 - 31.2 pg Final  . MCHC (Mean Corpuscular Hemoglobin * 04/07/2022 28.7 (L)  32.0 - 36.0 gm/dL Final  . Platelet Count 04/07/2022 344  150 - 450 10^3/uL Final  . RDW-CV (Red Cell Distribution Widt* 04/07/2022 20.4 (H)  11.6 - 14.8 % Final  . MPV (Mean Platelet Volume) 04/07/2022 9.5  9.4 - 12.4 fl Final  . Neutrophils 04/07/2022 2.90  1.50 - 7.80 10^3/uL Final  . Lymphocytes 04/07/2022 1.27  1.00 - 3.60 10^3/uL Final  . Monocytes 04/07/2022 0.37  0.00 - 1.50 10^3/uL Final  . Eosinophils 04/07/2022 0.08  0.00 - 0.55 10^3/uL Final  . Basophils 04/07/2022 0.03  0.00 - 0.09 10^3/uL Final  . Neutrophil % 04/07/2022 62.3  32.0 - 70.0 % Final  . Lymphocyte % 04/07/2022 27.3  10.0 - 50.0 % Final  . Monocyte % 04/07/2022 7.9  4.0 - 13.0 % Final  . Eosinophil % 04/07/2022 1.7  1.0  - 5.0 % Final  . Basophil% 04/07/2022 0.6  0.0 - 2.0 % Final  . Immature Granulocyte % 04/07/2022 0.2  <=0.7 % Final  . Immature Granulocyte Count 04/07/2022 0.01  <=0.06 10^3/L Final  . Ferritin 04/07/2022 4 (L)  11 - 307 ng/mL Final  . Iron 04/07/2022 20 (L)  28 - 170 ug/dL Final  . Total Iron Binding Capacity (TIBC) 04/07/2022 508.6 (H)  261.0 - 478.0 ug/dL Final  . Transferrin 89/87/7976 363.3 (H)  203.0 - 362.0 mg/dL Final  . % Saturation 89/87/7976 4  % Final  . H pylori Ag, Stl - [facility] 04/18/2022 Negative  Negative Final    Assessment and Plan  Annual Exam  Elevated thyroid levels - Plan: Recheck thyroid levels in 4 weeks. If still elevated, consider starting thyroid medication.  Left leg pain and swelling - History of 31 years of pain in the left thigh, knee, and hip, worsening with age and affecting bone strength. - Plan: Order knee x-ray. Prescribe anti-inflammatory medication (stronger than ibuprofen ) and monitor for side effects such as dizziness, heart palpitations, or chest pain.  Iron deficiency anemia - Hemoglobin 10.8, improved from previous levels. Currently taking an unknown iron supplement from her mother's country. - Plan: Continue iron supplementation and follow up with GI for further evaluation and management.  Recheck iron panel and ferritin in 4 weeks.   Stable exam.  CV screening labs reviewed with patient.  Up to date on pap smear, and colonoscopy.  Mammogram ordered.  Vaccinations reviewed.  Counseled  on nutrition modification and exercise.   Goals Addressed               This Visit's Progress   . * Lose Weight (pt-stated)   On track   .  Maintain health/healthy lifestyle         Follow up: 6 months, sooner if needed   Attestation Statement:   I personally performed the service, non-incident to. (WP)   GLENDA LYNN FIELDS, NP  *Some images could not be shown.

## 2022-06-17 ENCOUNTER — Other Ambulatory Visit: Payer: Self-pay | Admitting: Family Medicine

## 2022-06-17 DIAGNOSIS — Z1231 Encounter for screening mammogram for malignant neoplasm of breast: Secondary | ICD-10-CM

## 2022-07-29 NOTE — Progress Notes (Signed)
 PATIENT PROFILE: Nicole Walsh is a 62 y.o. female who presents for a visit to the GI clinic at [facility]  for a visit to follow up  PROBLEM LIST:  Patient Active Problem List  Diagnosis  . Hypertension (diet controlled)  . Prediabetes (A1C 5.8 on 07/31/19)    Endoscopic History: VCE 12/23-Dr Locklear- scattered mucosal ulcerations/erythem in the middle and distal third of small bowel. KUB after did not show any retained capsule. EGD 5/23- Dr Maryruth- normal esophagus, gastritis, normal duodenum. There was h pylori gastritis; there was no dysplasia/malignancy. She was treated with quadruple therapy Colonoscopy 5/23- Dr Maryruth- 2 small polyps- one was adenoma other benign tissue, diverticulosis, hemorrhoids. 7y repeat  INTERVAL HISTORY:  Nicole Walsh was last seen on 04/07/22 in follow up of endoscopies done for ida -at that time: IDA- recheck cbc/ferritin/iron panel H pylori gastritis- treated- check stool ag for eradication. Patient wanted to rx of pantoprazole for prn use- we discussed how to take it for effectiveness and the role of when to use tums/gaviscon, etc. Adenoma- colonoscopy 7y Follow up 1y sooner if problems CBC with hgb 9 and microcytic cells, iron low and ferritin low (10/23). H pylori stool antigen was negative. It was noted at the time of her visit she had been off iron supplementation and she had not been taking the pantoprazole as she had also run out. After labs were back she was advised to restart Fe for bid dosing and VCE was arranged Interpreter services provided by Ms Merrianne Since the last visit, had VCE as above. Interpreter services provided by [facility] on the [facility] tablet.  She is here with her husband. States she is doing well. Today. States she has had some intermittent luq pain- more with greasy and spicy foods. Feels bloated in this area intermittently. Denies NV, melena/dysphagia, rectal bleeding. Denies any constipation/diarrhea/bowel difficulties. Uses advil   once or twice weekly. Sometimes drinks a peppermint tea which helps her stomach and occasionally uses turmeric. She is taking Fe daily Labs last week- hgb 10.6 ferritin 6 but iron panel normal.  We discussed her symptoms, labs, endoscopic results/imaging and her plan of care. Her and her questions were answered.  Medications:  Outpatient Encounter Medications as of 07/29/2022  Medication Sig Dispense Refill  . diclofenac (VOLTAREN) 75 MG EC tablet Take 1 tablet (75 mg total) by mouth 2 (two) times daily with meals 60 tablet 5  . ferrous sulfate 325 (65 FE) MG EC tablet Take 325 mg by mouth daily with breakfast    . pantoprazole (PROTONIX) 40 MG DR tablet Take 1 tablet (40 mg total) by mouth once daily 30 tablet 3  . peg-electrolyte (NULYTELY) solution Take 4,000 mLs by mouth as directed 4000 mL 0  . [DISCONTINUED] pantoprazole (PROTONIX) 40 MG DR tablet Take 1 tablet (40 mg total) by mouth once daily 30 tablet 3   No facility-administered encounter medications on file as of 07/29/2022.     ALLERGIES: Patient has no known allergies.   PMHx:  Past Medical History:  Diagnosis Date  . Hypertension      PSHx: Past Surgical History:  Procedure Laterality Date  . CHOLECYSTECTOMY  2005   Laparoscopic cholecystectomy/cholangiogram/bile duct exploration.  . Colon @ [facility]  11/15/2021   Tubular adenomas/Repeat 66yrs/CTL  . EGD @ [facility]  11/15/2021   Gastritis/+H.Pylori/Repeat PRN/CTL     FAMILY HISTORY: Family History  Problem Relation Age of Onset  . High blood pressure (Hypertension) Mother   . High blood pressure (  Hypertension) Father   . Coronary Artery Disease (Blocked arteries around heart) Father   . Diabetes type II Neg Hx   . Colon polyps Neg Hx   . Breast cancer Neg Hx      Social History: Social History   Socioeconomic History  . Marital status: Married    Spouse name: Wilfredo  . Number of children: 5  Occupational History    Comment: [employer]   Tobacco Use  .  Smoking status: Never  . Smokeless tobacco: Never  Vaping Use  . Vaping Use: Never used  Substance and Sexual Activity  . Alcohol use: No  . Drug use: No  . Sexual activity: Yes    Partners: Male    Birth control/protection: Post-menopausal     REVIEW OF SYSTEMS:    10 systems reviewed, unremarkable other than what is written above.    PHYSICAL EXAM:  Vitals:   07/29/22 1113  BP: 135/56  Pulse: 104  Temp: 37.6 C (99.7 F)   Body mass index is 31.07 kg/m. Weight: 79.6 kg (175 lb 6.4 oz)   General Appearance:    Alert, cooperative, no distress, appears stated age Head:     Atraumatic, normocephalic Eyes:   Anciteric, conjunctiva pink. Mouth: no oral ulcers Lungs:     Respirations eupneic.  Clear to auscultation bilaterally  Heart:    Regular rate and rhythm, S1 and S2 normal, no murmur, rub   or gallop Abdomen:     Flat, bowel sounds x 4.Soft, non-tender/nondistended. No guarding, rigidity, rebound tenderness or other peritoneal signs Extremities:   No cyanosis or edema, moves all extremities well. Strength 5/5. Skin:   Skin color, texture, turgor normal, no rashes or lesions Neuro: alert, oriented x 3, cranial nerves II-XII intact Psych: pleasant, calm, cooperative, Logical thought and good insight.    REVIEW OF DATA: I have reviewed the following data today:  Prior notes labs endoscopy reports imaging    ASSESSMENT AND PLAN:   Luq pain, enteritis on VCE/IDA. Start pantoprazole 40mg  poqd. Assess fecal calprotectin, celiac panel, and CTE. Avoid nsaids, can use acetaminophen  by pack insert instructions.  Follow up 6w sooner if problems

## 2022-08-01 ENCOUNTER — Other Ambulatory Visit: Payer: Self-pay | Admitting: Gastroenterology

## 2022-08-01 DIAGNOSIS — K529 Noninfective gastroenteritis and colitis, unspecified: Secondary | ICD-10-CM

## 2022-08-01 DIAGNOSIS — D509 Iron deficiency anemia, unspecified: Secondary | ICD-10-CM

## 2022-08-26 ENCOUNTER — Ambulatory Visit
Admission: RE | Admit: 2022-08-26 | Discharge: 2022-08-26 | Disposition: A | Discharge status: Home / Self Care | Payer: BC Managed Care – PPO | Source: Ambulatory Visit | Attending: Gastroenterology | Admitting: Gastroenterology

## 2022-08-26 DIAGNOSIS — D509 Iron deficiency anemia, unspecified: Secondary | ICD-10-CM | POA: Diagnosis present

## 2022-08-26 DIAGNOSIS — K529 Noninfective gastroenteritis and colitis, unspecified: Secondary | ICD-10-CM | POA: Diagnosis present

## 2022-08-26 MED ORDER — IOHEXOL 300 MG/ML  SOLN
100.0000 mL | Freq: Once | INTRAMUSCULAR | Status: AC | PRN
  Administered 2022-08-26: 100 mL via INTRAVENOUS

## 2022-09-08 NOTE — Progress Notes (Signed)
 PATIENT PROFILE: Nicole Walsh is a 62 y.o. female who presents for a visit to the GI clinic at [facility]  for a visit to follow up  PROBLEM LIST:  Patient Active Problem List  Diagnosis  . Hypertension (diet controlled)  . Prediabetes (A1C 5.8 on 07/31/19)    Endoscopic History:  CTE 3/24- MPRESSION:  No radiographic evidence of inflammatory bowel disease or small  bowel pathology.  Mild colonic diverticulosis, without radiographic evidence of  diverticulitis.  VCE 12/23-Dr Locklear- scattered mucosal ulcerations/erythem in the middle and distal third of small bowel. KUB after did not show any retained capsule. EGD 5/23- Dr Maryruth- normal esophagus, gastritis, normal duodenum. There was h pylori gastritis; there was no dysplasia/malignancy. She was treated with quadruple therapy Colonoscopy 5/23- Dr Maryruth- 2 small polyps- one was adenoma other benign tissue, diverticulosis, hemorrhoids. 7y repeat  INTERVAL HISTORY:  Nicole Walsh was last seen on 07/29/22 in follow up of IDA/enteritis.  Luq pain, enteritis on VCE/IDA. Start pantoprazole 40mg  poqd. Assess fecal calprotectin, celiac panel, and CTE. Avoid nsaids, can use acetaminophen  by pack insert instructions.  Follow up 6w sooner if problems  Fecal calprotecin slightly elevated, celiac negative CTE as above Since the last visit, states she is better in her abdomen. Has been limiting her spicy food content. Continues on the daily pantoprazole 40mg  poqd. In medication review, she has been on 75mg  po Diclofenac daily since December for ongoing knee pain. Denies any further GI related concerns at this point. States she has been taking iron bid. We reviewed her exams, history, labs, and plan of care. Her questions were answered. Nicole Walsh provided interpreter services  Medications:  Outpatient Encounter Medications as of 09/08/2022  Medication Sig Dispense Refill  . diclofenac (VOLTAREN) 75 MG EC tablet Take 1 tablet (75 mg total)  by mouth 2 (two) times daily with meals 60 tablet 5  . ferrous sulfate 325 (65 FE) MG EC tablet Take 325 mg by mouth daily with breakfast    . pantoprazole (PROTONIX) 40 MG DR tablet Take 1 tablet (40 mg total) by mouth once daily 30 tablet 3  . peg-electrolyte (NULYTELY) solution Take 4,000 mLs by mouth as directed (Patient not taking: Reported on 09/08/2022) 4000 mL 0   No facility-administered encounter medications on file as of 09/08/2022.     ALLERGIES: Patient has no known allergies.   PMHx:  Past Medical History:  Diagnosis Date  . Hypertension      PSHx: Past Surgical History:  Procedure Laterality Date  . CHOLECYSTECTOMY  2005   Laparoscopic cholecystectomy/cholangiogram/bile duct exploration.  . Colon @ [facility]  11/15/2021   Tubular adenomas/Repeat 89yrs/CTL  . EGD @ [facility]  11/15/2021   Gastritis/+H.Pylori/Repeat PRN/CTL     FAMILY HISTORY: Family History  Problem Relation Age of Onset  . High blood pressure (Hypertension) Mother   . High blood pressure (Hypertension) Father   . Coronary Artery Disease (Blocked arteries around heart) Father   . Diabetes type II Neg Hx   . Colon polyps Neg Hx   . Breast cancer Neg Hx      Social History: Social History   Socioeconomic History  . Marital status: Married    Spouse name: Wilfredo  . Number of children: 5  Occupational History    Comment: [employer]   Tobacco Use  . Smoking status: Never  . Smokeless tobacco: Never  Vaping Use  . Vaping Use: Never used  Substance and Sexual Activity  . Alcohol use: No  .  Drug use: No  . Sexual activity: Yes    Partners: Male    Birth control/protection: Post-menopausal     REVIEW OF SYSTEMS:   10 systems reviewed, unremarkable other than what is written above.    PHYSICAL EXAM:  Vitals:   09/08/22 1147  BP: (!) 151/95  Pulse: 86  Temp: 36.6 C (97.8 F)   Body mass index is 30.75 kg/m. Weight: 78.7 kg (173 lb 9.6 oz)   General Appearance:    Alert,  cooperative, no distress, appears stated age Head:     Atraumatic, normocephalic Eyes:   Anciteric, conjunctiva pink. Mouth: no oral ulcers Lungs:     Respirations eupneic.  Clear to auscultation bilaterally  Heart:    Regular rate and rhythm, S1 and S2 normal, no murmur, rub   or gallop Abdomen:     Flat, bowel sounds x 4.Soft, non-tender/nondistended. No guarding, rigidity, rebound tenderness or other peritoneal signs Extremities:   No cyanosis or edema, moves all extremities well. Strength 5/5. Skin:   Skin color, texture, turgor normal, no rashes or lesions Neuro: alert, oriented x 3, cranial nerves II-XII intact Psych: pleasant, calm, cooperative, Logical thought and good insight.    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 Recommend to avoid all nsaids and discuss alternate pain med/treatment for her chronic knee pain. Continue pantoprazole 40mg  poqd. Reassess cbc, iron panel, ferritin today. Consider hematology referral if still problematic. H pylori gastritis- hold ppi x 10d and can take otc pepcid  bid at that time. Collect stool test for h pylori stool antigen- then can restart pantoprazole Follow up 78m sooner if problems

## 2024-01-10 NOTE — Progress Notes (Signed)
 Chief Complaint  Patient presents with  . Joint Pain    History of present illness:   I have been asked to see this patient in consultation by Nicole Gaetana Helling, Np [address] [place],  [place] [address]  Nicole Walsh is a 63 y.o.female who presents today for evaluation of polyarthralgia with positive ANA.   Pertinent positive labs include: RF 20.9, ANA direct, anti-Scl-70 >8.0 Pertinent negative labs include: ESR, CRP, RNP, Smith, SSA, SSB, Chromatin, Jo-1, Centromere B  Pertinent medical history: hypertension, prediabetes, history of SBO Pertinent medications: sucralfate , zofran , protonix, bentyl   Today She reports her joint pains started in her left shoulder that onset 5 months ago, was intense for two days, and then moved to her knee and began migrating to different joint areas. Two areas don't hurt at the same time. She does sometimes get periods that are pain-free before the pain comes back again. She has tried tylenol  and creams/gels which did help some. She notes when one of her joints is affected it hurts too much to use it at all. For instance, when it affected her left foot she was unable to put weight on it. When it affected her left shoulder she could not move it from her side. The most recent attack was her left knee two weeks ago, and then the pain moved to the adjacent calf. Attacked joints feel warm or hot to the touch and also swollen.  She notes she also has neck pain that lingers even between attacks, which she thinks is stress related, and started about a month ago. It does make it hard to turn her head.   Fatigue: Yes, onset 5 months ago.  Malaise: No Fevers: No Weight or appetite changes: No Rashes: No Photosensitivity: No Alopecia: No Raynaud's phenomenon: No Oral ulcers: No Dry mouth: No Dry eyes: No Pleuritic chest pain: No Shortness of breath: Only with anxious mood. Weakness: Yes, associated with pain and fatigue. 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 She is a never smoker. She drinks an average of 0 servings of alcohol weekly. Nicole Walsh reports a family history of osteoporosis in her mother, significant joint pains for her mother, thyroid disease in her mother and daughter, but denies any family history of known autoimmune disease otherwise.   Review of Systems Review of Systems  Constitutional:  Positive for malaise/fatigue. Negative for fever and weight loss.  Respiratory:  Negative for cough, shortness of breath and wheezing.   Cardiovascular:  Negative for chest pain, palpitations and leg swelling.  Gastrointestinal:  Negative for blood in stool, constipation, diarrhea, nausea and vomiting.  Genitourinary:  Negative for dysuria and hematuria.  Musculoskeletal:  Positive for joint pain. Negative for back pain, myalgias and neck pain.  Skin:  Negative for rash.  Neurological:  Negative for dizziness, weakness and headaches.  Psychiatric/Behavioral:  Positive for depression. The patient is nervous/anxious. The patient does not have insomnia.    ROS was negative except as noted above.  Patient Active Problem List   Diagnosis Date Noted  . Prediabetes (A1C 5.8 on 07/31/19) 11/11/2019  . Hypertension (diet controlled)     Past Medical History:  Diagnosis Date  . Hypertension    Past Surgical History:  Procedure Laterality Date  . CHOLECYSTECTOMY  2005   Laparoscopic cholecystectomy/cholangiogram/bile duct exploration.  . Colon @ [facility]  11/15/2021   Tubular adenomas/Repeat 48yrs/CTL  .  EGD @ [facility]  11/15/2021   Gastritis/+H.Pylori/Repeat PRN/CTL   Social History   Socioeconomic History  . Marital status: Married    Spouse name: Wilfredo  . Number of children: 5  Occupational History    Comment: [employer]   Tobacco Use  . Smoking status: Never  . Smokeless tobacco: Never  Vaping Use  . Vaping status:  Never Used  Substance and Sexual Activity  . Alcohol use: No  . Drug use: No  . Sexual activity: Yes    Partners: Male    Birth control/protection: Post-menopausal   Social Drivers of Health   Financial Resource Strain: Low Risk  (01/17/2024)   Overall Financial Resource Strain (CARDIA)   . Difficulty of Paying Living Expenses: Not hard at all  Food Insecurity: No Food Insecurity (01/17/2024)   Hunger Vital Sign   . Worried About Programme researcher, broadcasting/film/video in the Last Year: Never true   . Ran Out of Food in the Last Year: Never true  Transportation Needs: No Transportation Needs (01/17/2024)   PRAPARE - Transportation   . Lack of Transportation (Medical): No   . Lack of Transportation (Non-Medical): No  Housing Stability: Low Risk  (01/17/2024)   Housing Stability Vital Sign   . Unable to Pay for Housing in the Last Year: No   . Number of Times Moved in the Last Year: 0   . Homeless in the Last Year: No   Family History  Problem Relation Name Age of Onset  . High blood pressure (Hypertension) Mother    . High blood pressure (Hypertension) Father    . Coronary Artery Disease (Blocked arteries around heart) Father    . Diabetes type II Neg Hx    . Colon polyps Neg Hx    . Breast cancer Neg Hx      Physical Exam: BP (!) 148/92 (BP Location: Left upper arm, Patient Position: Sitting, BP Cuff Size: Adult)   Pulse 81   Ht 160 cm (5' 3)   Wt 79.4 kg (175 lb)   SpO2 98%   BMI 31.00 kg/m   General: Pleasant older latina woman sitting in chair. Well groomed, no acute distress. Non-toxic appearance.  HEENT: Conjunctivae normal.  Pulmonary: Normal effort of breathing. Symmetric chest expansion.  Musculoskeletal: Tenderness to palpation of left shoulder girdle and cervical paraspinals. No active synovitis today. Fist formation unimpaired bilaterally. Grip strength appropriate and equal. Negative squeeze tests to hands and feet. No other tender, synovitic, warm, erythematous, or deformed  joints. FROM all joints except as above.  Skin: Skin warm and dry. No rashes appreciable. Neurologic: Oriented to time, person, place, and situation.  Psychological: Normal behavior, thought content, and judgment. Records were reviewed Initial consult on 01/17/2024  Component Date Value Ref Range Status  . Color 01/17/2024 Yellow  Colorless, Straw, Light Yellow, Yellow, Dark Yellow Final  . Clarity 01/17/2024 Clear  Clear Final  . Specific Gravity 01/17/2024 1.024  1.005 - 1.030 Final  . pH, Urine 01/17/2024 5.0  5.0 - 8.0 Final  . Protein, Urinalysis 01/17/2024 Negative  Negative mg/dL Final  . Glucose, Urinalysis 01/17/2024 Negative  Negative mg/dL Final  . Ketones, Urinalysis 01/17/2024 Negative  Negative mg/dL Final  . Blood, Urinalysis 01/17/2024 Negative  Negative Final  . Nitrite, Urinalysis 01/17/2024 Negative  Negative Final  . Leukocyte Esterase, Urinalysis 01/17/2024 Negative  Negative Final  . Bilirubin, Urinalysis 01/17/2024 Negative  Negative Final  . Urobilinogen, Urinalysis 01/17/2024 0.2  0.2 - 1.0 mg/dL Final  .  WBC, UA 01/17/2024 6 (H)  <=5 /hpf Final  . Red Blood Cells, Urinalysis 01/17/2024 1  <=3 /hpf Final  . Bacteria, Urinalysis 01/17/2024 0-5  0 - 5 /hpf Final  . Squamous Epithelial Cells, Urinaly* 01/17/2024 0  /hpf Final  Office Visit on 10/30/2023  Component Date Value Ref Range Status  . ANA Direct - [facility] 10/30/2023 Positive (!)  Negative Final  . Anti-DNA (DS) Ab Qn - [facility] 10/30/2023 4  0 - 9 IU/mL Final  . RNP Antibodies - [facility] 10/30/2023 0.3  0.0 - 0.9 AI Final  . Smith Antibodies - [facility] 10/30/2023 <0.2  0.0 - 0.9 AI Final  . Antiscleroderma-70 Antibodies - [facility] 10/30/2023 >8.0 (H)  0.0 - 0.9 AI Final  . Sjogren's Anti-SS-A - [facility] 10/30/2023 <0.2  0.0 - 0.9 AI Final  . Sjogren's Anti-SS-B - [facility] 10/30/2023 <0.2  0.0 - 0.9 AI Final  . Antichromatin Antibodies - [facility] 10/30/2023 <0.2  0.0 - 0.9 AI Final  . Anti-Jo-1 - [facility]  10/30/2023 <0.2  0.0 - 0.9 AI Final  . Anti-Centromere B Antibodies - Lab* 10/30/2023 <0.2  0.0 - 0.9 AI Final  . See below: - [facility] 10/30/2023 Comment   Final  . C Reactive Protein - [facility] 10/30/2023 8  0 - 10 mg/L Final  . Sedimentation Rate-Automated 10/30/2023 27  0 - 30 mm/hr Final  . RA Latex Turbid. - [facility] 10/30/2023 20.9 (H)  <14.0 IU/mL Final  . Class Description - [facility] 10/30/2023 Comment   Final  . Immunoglobulin E, Total - [facility] 10/30/2023 3 (L)  6 - 495 IU/mL Final  . F026-IgE Pork - [facility] 10/30/2023 <0.10  Class 0 kU/L Final  . F027-IgE Beef - [facility] 10/30/2023 <0.10  Class 0 kU/L Final  . F088-IgE Lamb - [facility] 10/30/2023 <0.10  Class 0 kU/L Final  . Alpha-Gal IgE - [facility] 10/30/2023 <0.10  Class 0 kU/L Final     Assessment and Plan: Rheumatoid arthritis involving multiple sites with positive rheumatoid factor (CMS/HHS-HCC)  (primary encounter diagnosis) Plan: CBC w/auto Differential (5 Part), Hepatic        Function Panel (HFP), Creatinine, Sedimentation       Rate-Automated, C-Reactive Protein, Quant -        [facility], CCP Antibodies IgG/IgA - [facility],        predniSONE (DELTASONE) 5 MG tablet, folic acid        (FOLVITE) 1 MG tablet, methotrexate        (RHEUMATREX) 2.5 MG tablet  ANA positive Plan: Urinalysis w/Microscopic  Encounter for long-term (current) use of high-risk medication  History and physical to date are persuasive for a palindromic presentation of rheumatoid arthritis currently between active inflammatory episodes. She has serologies indicating she is at risk for scleroderma as well, but given lack of sclerodactyly or raynauds this does not appear to be an active diagnosis at present. We will proceed with treatment as for rheumatoid arthritis and continue to monitor for any evidence of additional disease processes. Prednisone taper for short-term disease control.  Risks, benefits, and alternatives to methotrexate discussed today and  patient expressed understanding.  Discussed potential side effects, including nausea/vomiting, oral ulcers, hair loss, and elevated LFTs.  Patient counseled about the importance of regular bloodwork monitoring, the need to supplement daily with folic acid, and alcohol avoidance. Will check baseline labs today with repeat in 4-6 weeks.  Discussed rheumatoid arthritis pathophysiology, clinical presentation, clinical course with and without treatment, and expectations of treatment.  Questions were  welcomed and answered.  Return in about 6 weeks (around 02/28/2024).     Medication List       * Accurate as of January 17, 2024 12:30 PM. If you have any questions, ask your nurse or doctor.          START taking these medications    folic acid 1 MG tablet Commonly known as: FOLVITE Take 1 tablet (1 mg total) by mouth once daily for 360 days Started by: ELSIE CONCH DEFOOR, PA   methotrexate 2.5 MG tablet Commonly known as: RHEUMATREX Take 6 tablets (15 mg total) by mouth every 7 (seven) days for 90 days Started by: ELSIE CONCH DEFOOR, PA       CHANGE how you take these medications    predniSONE 5 MG tablet Commonly known as: DELTASONE Take 4 tablets (20 mg total) by mouth once daily for 3 days, THEN 3 tablets (15 mg total) once daily for 3 days, THEN 2 tablets (10 mg total) once daily for 3 days, THEN 1 tablet (5 mg total) once daily for 3 days. Take in the mornings. Start taking on: January 17, 2024 What changed:  medication strength See the new instructions. Changed by: ELSIE CONCH DEFOOR, PA       CONTINUE taking these medications    ferrous sulfate 325 (65 FE) MG EC tablet         Where to Get Your Medications     These medications were sent to [facility] [address]), [place] - [address]  [address])  [id number]    Phone: [phone]  folic acid 1 MG tablet methotrexate 2.5 MG  tablet predniSONE 5 MG tablet    Orders Placed This Encounter  Procedures  . CBC w/auto Differential (5 Part)  . Hepatic Function Panel (HFP)  . Creatinine  . Sedimentation Rate-Automated  . C-Reactive Protein, Quant - [facility]  . CCP Antibodies IgG/IgA - [facility]  . Urinalysis w/Microscopic    All new prescription medications, changes in current prescription dosages, and sample medications were discussed with the patient, including patient education, medication name, use, dosage, potential side effects, drug interactions, consequences of not using/taking, and special instructions. Patient expressed understanding. No barriers to adherence. *Some images could not be shown.

## 2024-03-02 ENCOUNTER — Emergency Department: Payer: Self-pay

## 2024-03-02 ENCOUNTER — Other Ambulatory Visit: Payer: Self-pay

## 2024-03-02 ENCOUNTER — Observation Stay
Admission: EM | Admit: 2024-03-02 | Discharge: 2024-03-03 | Disposition: A | Discharge status: Home / Self Care | Payer: Self-pay | Attending: Emergency Medicine | Admitting: Emergency Medicine

## 2024-03-02 ENCOUNTER — Encounter: Payer: Self-pay | Admitting: Emergency Medicine

## 2024-03-02 DIAGNOSIS — K56609 Unspecified intestinal obstruction, unspecified as to partial versus complete obstruction: Principal | ICD-10-CM | POA: Insufficient documentation

## 2024-03-02 DIAGNOSIS — R1032 Left lower quadrant pain: Principal | ICD-10-CM

## 2024-03-02 DIAGNOSIS — D72829 Elevated white blood cell count, unspecified: Secondary | ICD-10-CM | POA: Insufficient documentation

## 2024-03-02 DIAGNOSIS — K529 Noninfective gastroenteritis and colitis, unspecified: Secondary | ICD-10-CM

## 2024-03-02 DIAGNOSIS — K567 Ileus, unspecified: Secondary | ICD-10-CM

## 2024-03-02 LAB — COMPREHENSIVE METABOLIC PANEL WITH GFR
ALT: 22 U/L (ref 0–44)
AST: 54 U/L — ABNORMAL HIGH (ref 15–41)
Albumin: 4.7 g/dL (ref 3.5–5.0)
Alkaline Phosphatase: 155 U/L — ABNORMAL HIGH (ref 38–126)
Anion gap: 10 (ref 5–15)
BUN: 15 mg/dL (ref 8–23)
CO2: 24 mmol/L (ref 22–32)
Calcium: 9.9 mg/dL (ref 8.9–10.3)
Chloride: 101 mmol/L (ref 98–111)
Creatinine, Ser: 0.95 mg/dL (ref 0.44–1.00)
GFR, Estimated: >60 mL/min (ref >60)
Glucose, Bld: 133 mg/dL — ABNORMAL HIGH (ref 70–99)
Potassium: 3.8 mmol/L (ref 3.5–5.1)
Sodium: 135 mmol/L (ref 135–145)
Total Bilirubin: 1 mg/dL (ref 0.0–1.2)
Total Protein: 8 g/dL (ref 6.5–8.1)

## 2024-03-02 LAB — URINALYSIS, ROUTINE W REFLEX MICROSCOPIC
Bacteria, UA: NONE SEEN
Bilirubin Urine: NEGATIVE
Glucose, UA: NEGATIVE mg/dL
Hgb urine dipstick: NEGATIVE
Ketones, ur: NEGATIVE mg/dL
Leukocytes,Ua: NEGATIVE
Nitrite: NEGATIVE
Protein, ur: 100 mg/dL — AB
Specific Gravity, Urine: 1.032 — ABNORMAL HIGH (ref 1.005–1.030)
pH: 5 (ref 5.0–8.0)

## 2024-03-02 LAB — CBC
HCT: 42.9 % (ref 36.0–46.0)
Hemoglobin: 13.7 g/dL (ref 12.0–15.0)
MCH: 28.4 pg (ref 26.0–34.0)
MCHC: 31.9 g/dL (ref 30.0–36.0)
MCV: 89 fL (ref 80.0–100.0)
Platelets: 391 K/uL (ref 150–400)
RBC: 4.82 MIL/uL (ref 3.87–5.11)
RDW: 16.4 % — ABNORMAL HIGH (ref 11.5–15.5)
WBC: 13.4 K/uL — ABNORMAL HIGH (ref 4.0–10.5)
nRBC: 0 % (ref 0.0–0.2)

## 2024-03-02 LAB — LIPASE, BLOOD: Lipase: 36 U/L (ref 11–51)

## 2024-03-02 MED ORDER — SACCHAROMYCES BOULARDII 250 MG PO CAPS
250.0000 mg | ORAL_CAPSULE | Freq: Two times a day (BID) | ORAL | Status: DC
  Administered 2024-03-02 – 2024-03-03 (×3): 250 mg via ORAL
  Filled 2024-03-02 (×4): qty 1

## 2024-03-02 MED ORDER — ONDANSETRON HCL 4 MG/2ML IJ SOLN: 4.0000 mg | Freq: Four times a day (QID) | INTRAMUSCULAR | Status: DC | PRN

## 2024-03-02 MED ORDER — ACETAMINOPHEN 650 MG RE SUPP: 650.0000 mg | Freq: Four times a day (QID) | RECTAL | Status: DC | PRN

## 2024-03-02 MED ORDER — ONDANSETRON HCL 4 MG PO TABS: 4.0000 mg | ORAL_TABLET | Freq: Four times a day (QID) | ORAL | Status: DC | PRN

## 2024-03-02 MED ORDER — ONDANSETRON HCL 4 MG/2ML IJ SOLN
4.0000 mg | Freq: Once | INTRAMUSCULAR | Status: AC | PRN
  Administered 2024-03-02: 4 mg via INTRAVENOUS
  Filled 2024-03-02: qty 2

## 2024-03-02 MED ORDER — MORPHINE SULFATE (PF) 4 MG/ML IV SOLN
4.0000 mg | Freq: Once | INTRAVENOUS | Status: AC
  Administered 2024-03-02: 4 mg via INTRAVENOUS
  Filled 2024-03-02: qty 1

## 2024-03-02 MED ORDER — ACETAMINOPHEN 325 MG PO TABS: 650.0000 mg | ORAL_TABLET | Freq: Four times a day (QID) | ORAL | Status: DC | PRN

## 2024-03-02 MED ORDER — SENNOSIDES-DOCUSATE SODIUM 8.6-50 MG PO TABS: 1.0000 | ORAL_TABLET | Freq: Every evening | ORAL | Status: DC | PRN

## 2024-03-02 MED ORDER — ENOXAPARIN SODIUM 40 MG/0.4ML IJ SOSY
40.0000 mg | PREFILLED_SYRINGE | INTRAMUSCULAR | Status: DC
  Administered 2024-03-02: 40 mg via SUBCUTANEOUS
  Filled 2024-03-02: qty 0.4

## 2024-03-02 MED ORDER — SODIUM CHLORIDE 0.9 % IV BOLUS
1000.0000 mL | Freq: Once | INTRAVENOUS | Status: AC
  Administered 2024-03-02: 1000 mL via INTRAVENOUS

## 2024-03-02 MED ORDER — IOHEXOL 300 MG/ML  SOLN
100.0000 mL | Freq: Once | INTRAMUSCULAR | Status: AC | PRN
  Administered 2024-03-02: 100 mL via INTRAVENOUS

## 2024-03-02 MED ORDER — HYDROMORPHONE HCL 1 MG/ML IJ SOLN: 0.5000 mg | INTRAMUSCULAR | Status: DC | PRN

## 2024-03-02 MED ORDER — KETOROLAC TROMETHAMINE 30 MG/ML IJ SOLN
15.0000 mg | Freq: Once | INTRAMUSCULAR | Status: AC
  Administered 2024-03-02: 15 mg via INTRAVENOUS
  Filled 2024-03-02: qty 1

## 2024-03-02 MED ORDER — SODIUM CHLORIDE 0.9 % IV SOLN: INTRAVENOUS | Status: AC

## 2024-03-02 NOTE — Consult Note (Signed)
 [facility] SURGICAL CONSULTATION NOTE (initial) - cpt: 99254   HISTORY OF PRESENT ILLNESS (HPI):  63 y.o. female presented to [facility] ED today for evaluation of crampy abdominal pain with associated c/o distention, nausea and vomiting.  Emesis not described as bilious.  Denies fevers and chills.  No known new food exposures or unusual elements in her diet. Patient reports prior similar history with 3-day hospitalization back in 2020.  Treated with NG tube decompression with full resolution. I personally reviewed the CT scans and concur with radiologist report. Surgery is consulted by ED physician Dr. Arlander in this context for evaluation and management of SBO versus enteritis.    History obtained via interpretation by daughter, who was comfortable with interpretation.  Actuary.  PAST MEDICAL HISTORY (PMH):  Past Medical History:  Diagnosis Date   Anemia    Hypertension    Renal disorder      PAST SURGICAL HISTORY (PSH):  Past Surgical History:  Procedure Laterality Date   CHOLECYSTECTOMY  2005   COLONOSCOPY WITH PROPOFOL  N/A 11/15/2021   Procedure: COLONOSCOPY WITH PROPOFOL ;  Surgeon: Maryruth Ole DASEN, MD;  Location: [facility] ENDOSCOPY;  Service: Endoscopy;  Laterality: N/A;  SPANISH INTERPRETER   ESOPHAGOGASTRODUODENOSCOPY (EGD) WITH PROPOFOL  N/A 11/15/2021   Procedure: ESOPHAGOGASTRODUODENOSCOPY (EGD) WITH PROPOFOL ;  Surgeon: Maryruth Ole DASEN, MD;  Location: [facility] ENDOSCOPY;  Service: Endoscopy;  Laterality: N/A;     MEDICATIONS:  Prior to Admission medications   Medication Sig Start Date End Date Taking? Authorizing Provider  folic acid (FOLVITE) 1 MG tablet Take 1 mg by mouth daily. 01/17/24 01/11/25 Yes [provider]  methotrexate (RHEUMATREX) 2.5 MG tablet Take 15 mg by mouth once a week.    [provider]     ALLERGIES:  No Known Allergies   SOCIAL HISTORY:  Social History   Socioeconomic History   Marital status: Married     Spouse name: Not on file   Number of children: Not on file   Years of education: Not on file   Highest education level: Not on file  Occupational History   Not on file  Tobacco Use   Smoking status: Never   Smokeless tobacco: Never  Vaping Use   Vaping status: Never Used  Substance and Sexual Activity   Alcohol use: Never   Drug use: Never   Sexual activity: Yes    Birth control/protection: Post-menopausal  Other Topics Concern   Not on file  Social History Narrative   Not on file   Social Drivers of Health   Financial Resource Strain: Low Risk  (01/17/2024)   Received from [facility]   Overall Financial Resource Strain (CARDIA)    Difficulty of Paying Living Expenses: Not hard at all  Food Insecurity: No Food Insecurity (01/17/2024)   Received from [facility]   Hunger Vital Sign    Within the past 12 months, you worried that your food would run out before you got the money to buy more.: Never true    Within the past 12 months, the food you bought just didn't last and you didn't have money to get more.: Never true  Transportation Needs: No Transportation Needs (01/17/2024)   Received from [facility] - Transportation    In the past 12 months, has lack of transportation kept you from medical appointments or from getting medications?: No    Lack of Transportation (Non-Medical): No  Physical Activity: Not on file  Stress: Not on file  Social Connections: Not on file  Intimate Partner Violence: Not on file     FAMILY HISTORY:  Family History  Problem Relation Age of Onset   Hypertension Mother    Hypertension Father    Coronary artery disease Father    Breast cancer Neg Hx       REVIEW OF SYSTEMS:  Review of Systems  All other systems reviewed and are negative.   VITAL SIGNS:  Temp:  [98.1 F (36.7 C)-98.7 F (37.1 C)] 98.1 F (36.7 C) (09/06 0826) Pulse Rate:  [69-97] 69 (09/06 1030) Resp:   [16-18] 16 (09/06 0831) BP: (136-176)/(72-98) 157/80 (09/06 1030) SpO2:  [93 %-100 %] 100 % (09/06 1030)     Height: 5' 5 (165.1 cm)       INTAKE/OUTPUT:  No intake/output data recorded.  PHYSICAL EXAM:  Physical Exam Blood pressure (!) 157/80, pulse 69, temperature 98.1 F (36.7 C), temperature source Oral, resp. rate 16, height 5' 5 (1.651 m), SpO2 100%.   CONSTITUTIONAL: Well developed, and nourished, appropriately responsive and aware without distress.   EYES: Sclera non-icteric.   EARS, NOSE, MOUTH AND THROAT:  The oropharynx is clear. Oral mucosa is pink and moist.    Hearing is intact to voice.  NECK: Trachea is midline, and there is no jugular venous distension.  LYMPH NODES:  Lymph nodes in the neck are not enlarged. RESPIRATORY:  Lungs are clear, and breath sounds are equal bilaterally.   Normal respiratory effort without pathologic use of accessory muscles. CARDIOVASCULAR: Heart is regular in rate and rhythm.   Well perfused.  GI: The abdomen is  soft, nontender, and nondistended.  There are fine well-healed laparoscopic cholecystectomy scars, without fascial defect or mass appreciated.  I did not appreciate hepatosplenomegaly. There were normal bowel sounds. MUSCULOSKELETAL:  Symmetrical muscle tone appreciated in all four extremities. Warm without edema.  SKIN: Skin turgor is normal. No pathologic skin lesions appreciated.  NEUROLOGIC:  Motor and sensation appear grossly normal.  Cranial nerves are grossly without defect. PSYCH:  Alert and oriented to person, place and time. Affect is appropriate for situation.  Data Reviewed I have personally reviewed what is currently available of the patient's imaging, recent labs and medical records.    Labs:     Latest Ref Rng & Units 03/02/2024    4:51 AM 08/04/2020    5:48 PM 07/05/2018   11:30 PM  CBC  WBC 4.0 - 10.5 K/uL 13.4  16.8  10.4   Hemoglobin 12.0 - 15.0 g/dL 86.2  89.5  9.7   Hematocrit 36.0 - 46.0 % 42.9  33.1  32.3    Platelets 150 - 400 K/uL 391  369  346       Latest Ref Rng & Units 03/02/2024    4:51 AM 08/04/2020    5:48 PM 07/06/2018    5:51 AM  CMP  Glucose 70 - 99 mg/dL 866  869  850   BUN 8 - 23 mg/dL 15  18  15    Creatinine 0.44 - 1.00 mg/dL 9.04  9.04  9.15   Sodium 135 - 145 mmol/L 135  139  137   Potassium 3.5 - 5.1 mmol/L 3.8  3.7  4.2   Chloride 98 - 111 mmol/L 101  105  106   CO2 22 - 32 mmol/L 24  23  24    Calcium 8.9 - 10.3 mg/dL 9.9  9.5  8.7   Total Protein 6.5 - 8.1  g/dL 8.0  7.7    Total Bilirubin 0.0 - 1.2 mg/dL 1.0  0.6    Alkaline Phos 38 - 126 U/L 155  98    AST 15 - 41 U/L 54  32    ALT 0 - 44 U/L 22  16       Imaging studies:   Last 24 hrs: CT ABDOMEN PELVIS W CONTRAST Result Date: 03/02/2024 CLINICAL DATA:  Abdominal pain and nausea.  Vomiting. EXAM: CT ABDOMEN AND PELVIS WITH CONTRAST TECHNIQUE: Multidetector CT imaging of the abdomen and pelvis was performed using the standard protocol following bolus administration of intravenous contrast. RADIATION DOSE REDUCTION: This exam was performed according to the departmental dose-optimization program which includes automated exposure control, adjustment of the mA and/or kV according to patient size and/or use of iterative reconstruction technique. CONTRAST:  OMNIPAQUE  IOHEXOL  300 MG/ML  SOLN COMPARISON:  08/26/2022 FINDINGS: Lower chest: Unremarkable. Hepatobiliary: No suspicious focal abnormality within the liver parenchyma. Mild intrahepatic biliary duct prominence evident. Gallbladder is surgically absent. Common duct distended up to 16 mm in the hepatoduodenal ligament, stable since prior. Pancreas: No focal mass lesion. No dilatation of the main duct. No intraparenchymal cyst. No peripancreatic edema. Spleen: No splenomegaly. No suspicious focal mass lesion. Adrenals/Urinary Tract: No adrenal nodule or mass. Tiny well-defined homogeneous low-density lesions in both kidneys are too small to characterize but are  statistically most likely benign and probably cysts. No followup imaging is recommended. No evidence for hydroureter. The urinary bladder appears normal for the degree of distention. Stomach/Bowel: Stomach is unremarkable. No gastric wall thickening. No evidence of outlet obstruction. Duodenum is normally positioned as is the ligament of Treitz. Diffuse distention of small bowel identified. These distended fluid-filled small bowel loops measure up to about 2.8 cm maximum diameter. Small bowel distension extends into the pelvis where there is a abnormal segment of small bowel demonstrating luminal narrowing and circumferential hyperemia wall thickening creating a relatively abrupt transition zone (see axial 65/2 and well demonstrated on coronal 50/6). Small bowel distal to this segment is fluid-filled and distended as well. Scattered areas of interloop mesenteric fluid are evident. Terminal ileum unremarkable. The appendix is normal. No gross colonic mass. No colonic wall thickening. Diverticuli are seen scattered along the entire length of the colon without CT findings of diverticulitis. Vascular/Lymphatic: No abdominal aortic aneurysm. No abdominal aortic atherosclerotic calcification. There is no gastrohepatic or hepatoduodenal ligament lymphadenopathy. No retroperitoneal or mesenteric lymphadenopathy. No pelvic sidewall lymphadenopathy. Reproductive: Unremarkable. Other: Trace free fluid is seen in the cul-de-sac. Musculoskeletal: No worrisome lytic or sclerotic osseous abnormality. IMPRESSION: 1. Diffuse distention of small bowel with scattered areas of interloop mesenteric fluid. Abnormal segment of small bowel in the pelvis demonstrates luminal narrowing and circumferential hyperemia wall thickening creating a relatively abrupt transition zone. Small bowel distal to this abnormal segment is fluid-filled and distended although to a potentially lesser degree. Changes of focal infectious/inflammatory enteritis  could have this appearance, likely with underlying component of inflammatory stricture. 2. Mild intrahepatic and extrahepatic biliary duct prominence, stable since prior. This is likely related to prior cholecystectomy. Correlation with liver function test recommended. Electronically Signed   By: Camellia Candle M.D.   On: 03/02/2024 07:50      Assessment/Plan:  63 y.o. female with enteritis/inflammatory appearance of a section of small bowel likely the source of the patient's current symptoms, minimal surgical history to suggest adhesive disease.    Patient Active Problem List   Diagnosis Date Noted  Enteritis 03/02/2024   Small bowel obstruction (HCC) 07/06/2018   SBO (small bowel obstruction) (HCC) 03/25/2018    - If she has additional vomiting may warrant placement of NG tube.   - She appears comfortable at this point in time and I suspect may even tolerate clear liquid diet, but will defer until tomorrow to see how she progresses.  - May warrant further imaging by utilization of small bowel, noting that CT enterography from March 2024 was negative.  Repeat follow-through with contrast or perhaps even capsule endoscopy later as an outpatient should this presenting issue also completely resolve.  - She appears that she continues to be taking methotrexate  - DVT prophylaxis  All of the above findings and recommendations were discussed with the patient and  family(if present), and all of patient's and present family's questions were answered to their expressed satisfaction.  I personally spent a total of 60 minutes in the care of the patient today including preparing to see the patient, getting/reviewing separately obtained history, performing a medically appropriate exam/evaluation, documenting clinical information in the EHR, and independently interpreting results.    Thank you for the opportunity to participate in this patient's care.   -- Honor Leghorn, M.D., FACS 03/02/2024, 10:44  AM

## 2024-03-02 NOTE — ED Notes (Signed)
 Pt ambulated to in room toilet without assistance from staff. Pt reported some lightheaded upon standing. Pt returned to bed and was reconnected to VS monitoring equipment. Pt tolerated activity well. Pt's bed is in the lowest locked position.   Pt also reported the pain has gone away, but they are feeling cramping from the LUQ to the center of their abdomen. Pt also reports that they feel bloated.

## 2024-03-02 NOTE — ED Provider Notes (Signed)
 [facility] Provider Note    Event Date/Time   First MD Initiated Contact with Patient 03/02/24 [phone]     (approximate)   History   Abdominal Pain and Nausea  Spanish interpreter used HPI  Nicole Walsh is a 63 y.o. female with a history of cholecystectomy and reported history of small bowel obstruction who presents with left sided abdominal pain with nausea which started about 24 hours ago.  No fevers, no diarrhea reported.     Physical Exam   Triage Vital Signs: ED Triage Vitals [03/02/24 0444]  Encounter Vitals Group     BP (!) 176/98     Girls Systolic BP Percentile      Girls Diastolic BP Percentile      Boys Systolic BP Percentile      Boys Diastolic BP Percentile      Pulse Rate 97     Resp 18     Temp 98.4 F (36.9 C)     Temp Source Oral     SpO2 100 %     Weight      Height 1.651 m (5' 5)     Head Circumference      Peak Flow      Pain Score 10     Pain Loc      Pain Education      Exclude from Growth Chart     Most recent vital signs: Vitals:   03/02/24 0826 03/02/24 0831  BP:  (!) 159/85  Pulse:  79  Resp:  16  Temp: 98.1 F (36.7 C)   SpO2:  100%     General: Awake, no distress.  CV:  Good peripheral perfusion.  Resp:  Normal effort.  Abd:  No distention, tenderness primarily in the left lower quadrant, no CVA tenderness Other:     ED Results / Procedures / Treatments   Labs (all labs ordered are listed, but only abnormal results are displayed) Labs Reviewed  COMPREHENSIVE METABOLIC PANEL WITH GFR - Abnormal; Notable for the following components:      Result Value   Glucose, Bld 133 (*)    AST 54 (*)    Alkaline Phosphatase 155 (*)    All other components within normal limits  CBC - Abnormal; Notable for the following components:   WBC 13.4 (*)    RDW 16.4 (*)    All other components within normal limits  URINALYSIS, ROUTINE W REFLEX MICROSCOPIC - Abnormal; Notable for the following components:    Color, Urine AMBER (*)    APPearance HAZY (*)    Specific Gravity, Urine 1.032 (*)    Protein, ur 100 (*)    All other components within normal limits  CALPROTECTIN, FECAL  LIPASE, BLOOD     EKG  ED ECG REPORT I, Lamar Price, the attending physician, personally viewed and interpreted this ECG.  Date: 03/02/2024  Rhythm: normal sinus rhythm QRS Axis: normal Intervals: normal ST/T Wave abnormalities: normal Narrative Interpretation: no evidence of acute ischemia    RADIOLOGY CT abdomen pelvis viewed interpreted by me, suspicious for small bowel obstruction, pending radiology review    PROCEDURES:  Critical Care performed:   Procedures   MEDICATIONS ORDERED IN ED: Medications  ondansetron  (ZOFRAN ) injection 4 mg (4 mg Intravenous Given 03/02/24 0456)  morphine  (PF) 4 MG/ML injection 4 mg (4 mg Intravenous Given 03/02/24 0500)  ketorolac  (TORADOL ) 30 MG/ML injection 15 mg (15 mg Intravenous Given 03/02/24 0500)  sodium chloride  0.9 %  bolus 1,000 mL (0 mLs Intravenous Stopped 03/02/24 0825)  iohexol  (OMNIPAQUE ) 300 MG/ML solution 100 mL (100 mLs Intravenous Contrast Given 03/02/24 0713)     IMPRESSION / MDM / ASSESSMENT AND PLAN / ED COURSE  I reviewed the triage vital signs and the nursing notes. Patient's presentation is most consistent with acute presentation with potential threat to life or bodily function.   Patient presents with abdominal pain, nausea as detailed above.  Differential includes small bowel obstruction, diverticulitis, less likely pancreatitis/ureterolithiasis  Will treat with IV morphine , IV Zofran , IV fluids, obtain labs, CT abdomen pelvis  Lab work notable for elevated white blood cell count, elevated ALK Foss  CT scan is pending  ----------------------------------------- 7:25 AM on 03/02/2024 ----------------------------------------- Patient is feeling improved, declining further pain medication at this  time.  ----------------------------------------- 8:57 AM on 03/02/2024 ----------------------------------------- CT scan demonstrates small bowel inflammation with area of narrowing likely related to infectious or inflammatory enteritis  Discussed with Dr. Lane of surgery who agrees not a surgical issue at this time  Discussed with the hospitalist for admission     FINAL CLINICAL IMPRESSION(S) / ED DIAGNOSES   Final diagnoses:  Left lower quadrant abdominal pain  Enteritis     Rx / DC Orders   ED Discharge Orders     None        Note:  This document was prepared using Dragon voice recognition software and may include unintentional dictation errors.   Arlander [place], MD 03/02/24 1006

## 2024-03-02 NOTE — ED Notes (Signed)
Urine sample collected and sent to lab at this time.

## 2024-03-02 NOTE — H&P (Addendum)
 History and Physical    Nicole Walsh FMW:[id number] DOB: 12-11-1960 DOA: 03/02/2024  PCP: [facility] (Confirm with patient/family/NH records and if not entered, this has to be entered at [facility] point of entry) Patient coming from: Home  I have personally briefly reviewed patient's old medical records in [facility]  Chief Complaint: Abdominal pain, nauseous vomiting poor intake  HPI: Nicole Walsh is a 63 y.o. female with medical history significant of rheumatoid arthritis on methotrexate, presented with new onset of abdominal pain nauseous vomiting.  Symptoms started 2 days ago, patient started to have initially was a sharp like right-sided abdominal pain then expanded to left side and became constant, and yesterday the pain turned to cramping and patient started to feel nauseous and had 4-5 times of vomiting of stomach content.  She was only able to eat 1 meal yesterday but soon vomited stomach content out.  She had 1 normal formed bowel movement yesterday morning.  The abdominal pain has been constant for the last 2 days with only short ease for few minutes in between.  She has been taking Tylenol  only for the pain.  Denies any fever or chills.  ED Course: Afebrile, nontachycardic blood pressure 150/80 O2 saturation 100% on room air.  CT abdomen pelvis showed diffuse distention of small bowel with scattered areas of interloop mesenteric fluid abnormal segment of the small bowel and pelvis demonstrated luminal narrowing and circumferential hyperemia or thickening creed relative abrupt transition from, changes of focal infection/inflammatory enteritis are possible.  Blood work showed WBC 13 hemoglobin 13 BUN 15 creatinine 0.8 AST 52 ALT 22 bilirubin 1.0.  Review of Systems: As per HPI otherwise 14 point review of systems negative.    Past Medical History:  Diagnosis Date   Anemia    Hypertension    Renal disorder     Past Surgical History:  Procedure Laterality Date    CHOLECYSTECTOMY  2005   COLONOSCOPY WITH PROPOFOL  N/A 11/15/2021   Procedure: COLONOSCOPY WITH PROPOFOL ;  Surgeon: Maryruth Ole DASEN, MD;  Location: [facility] ENDOSCOPY;  Service: Endoscopy;  Laterality: N/A;  SPANISH INTERPRETER   ESOPHAGOGASTRODUODENOSCOPY (EGD) WITH PROPOFOL  N/A 11/15/2021   Procedure: ESOPHAGOGASTRODUODENOSCOPY (EGD) WITH PROPOFOL ;  Surgeon: Maryruth Ole DASEN, MD;  Location: [facility] ENDOSCOPY;  Service: Endoscopy;  Laterality: N/A;     reports that she has never smoked. She has never used smokeless tobacco. She reports that she does not drink alcohol and does not use drugs.  No Known Allergies  Family History  Problem Relation Age of Onset   Hypertension Mother    Hypertension Father    Coronary artery disease Father    Breast cancer Neg Hx      Prior to Admission medications   Medication Sig Start Date End Date Taking? Authorizing Provider  folic acid (FOLVITE) 1 MG tablet Take 1 mg by mouth daily. 01/17/24 01/11/25 Yes [provider]  methotrexate (RHEUMATREX) 2.5 MG tablet Take 15 mg by mouth once a week.    [provider]    Physical Exam: Vitals:   03/02/24 0900 03/02/24 0930 03/02/24 1000 03/02/24 1030  BP: 137/78 (!) 154/87 (!) 153/79 (!) 157/80  Pulse: 70 73 78 69  Resp:      Temp:      TempSrc:      SpO2: 99% 100% 100% 100%  Height:        Constitutional: NAD, calm, comfortable Vitals:   03/02/24 0900 03/02/24 0930 03/02/24 1000 03/02/24 1030  BP:  137/78 (!) 154/87 (!) 153/79 (!) 157/80  Pulse: 70 73 78 69  Resp:      Temp:      TempSrc:      SpO2: 99% 100% 100% 100%  Height:       Eyes: PERRL, lids and conjunctivae normal ENMT: Mucous membranes are moist. Posterior pharynx clear of any exudate or lesions.Normal dentition.  Neck: normal, supple, no masses, no thyromegaly Respiratory: clear to auscultation bilaterally, no wheezing, no crackles. Normal respiratory effort. No accessory muscle use.  Cardiovascular: Regular  rate and rhythm, no murmurs / rubs / gallops. No extremity edema. 2+ pedal pulses. No carotid bruits.  Abdomen: mild tenderness on periumbilical area, no rebound no guarding, no masses palpated. No hepatosplenomegaly. Bowel sounds positive.  Musculoskeletal: no clubbing / cyanosis. No joint deformity upper and lower extremities. Good ROM, no contractures. Normal muscle tone.  Skin: no rashes, lesions, ulcers. No induration Neurologic: CN 2-12 grossly intact. Sensation intact, DTR normal. Strength 5/5 in all 4.  Psychiatric: Normal judgment and insight. Alert and oriented x 3. Normal mood.     Labs on Admission: I have personally reviewed following labs and imaging studies  CBC: Recent Labs  Lab 03/02/24 0451  WBC 13.4*  HGB 13.7  HCT 42.9  MCV 89.0  PLT 391   Basic Metabolic Panel: Recent Labs  Lab 03/02/24 0451  NA 135  K 3.8  CL 101  CO2 24  GLUCOSE 133*  BUN 15  CREATININE 0.95  CALCIUM 9.9   GFR: CrCl cannot be calculated (Unknown ideal weight.). Liver Function Tests: Recent Labs  Lab 03/02/24 0451  AST 54*  ALT 22  ALKPHOS 155*  BILITOT 1.0  PROT 8.0  ALBUMIN 4.7   Recent Labs  Lab 03/02/24 0451  LIPASE 36   No results for input(s): AMMONIA in the last 168 hours. Coagulation Profile: No results for input(s): INR, PROTIME in the last 168 hours. Cardiac Enzymes: No results for input(s): CKTOTAL, CKMB, CKMBINDEX, TROPONINI in the last 168 hours. BNP (last 3 results) No results for input(s): PROBNP in the last 8760 hours. HbA1C: No results for input(s): HGBA1C in the last 72 hours. CBG: No results for input(s): GLUCAP in the last 168 hours. Lipid Profile: No results for input(s): CHOL, HDL, LDLCALC, TRIG, CHOLHDL, LDLDIRECT in the last 72 hours. Thyroid Function Tests: No results for input(s): TSH, T4TOTAL, FREET4, T3FREE, THYROIDAB in the last 72 hours. Anemia Panel: No results for input(s): VITAMINB12,  FOLATE, FERRITIN, TIBC, IRON, RETICCTPCT in the last 72 hours. Urine analysis:    Component Value Date/Time   COLORURINE AMBER (A) 03/02/2024 0517   APPEARANCEUR HAZY (A) 03/02/2024 0517   LABSPEC 1.032 (H) 03/02/2024 0517   PHURINE 5.0 03/02/2024 0517   GLUCOSEU NEGATIVE 03/02/2024 0517   HGBUR NEGATIVE 03/02/2024 0517   BILIRUBINUR NEGATIVE 03/02/2024 0517   KETONESUR NEGATIVE 03/02/2024 0517   PROTEINUR 100 (A) 03/02/2024 0517   NITRITE NEGATIVE 03/02/2024 0517   LEUKOCYTESUR NEGATIVE 03/02/2024 0517    Radiological Exams on Admission: CT ABDOMEN PELVIS W CONTRAST Result Date: 03/02/2024 CLINICAL DATA:  Abdominal pain and nausea.  Vomiting. EXAM: CT ABDOMEN AND PELVIS WITH CONTRAST TECHNIQUE: Multidetector CT imaging of the abdomen and pelvis was performed using the standard protocol following bolus administration of intravenous contrast. RADIATION DOSE REDUCTION: This exam was performed according to the departmental dose-optimization program which includes automated exposure control, adjustment of the mA and/or kV according to patient size and/or use of iterative reconstruction technique. CONTRAST:  OMNIPAQUE  IOHEXOL  300 MG/ML  SOLN COMPARISON:  08/26/2022 FINDINGS: Lower chest: Unremarkable. Hepatobiliary: No suspicious focal abnormality within the liver parenchyma. Mild intrahepatic biliary duct prominence evident. Gallbladder is surgically absent. Common duct distended up to 16 mm in the hepatoduodenal ligament, stable since prior. Pancreas: No focal mass lesion. No dilatation of the main duct. No intraparenchymal cyst. No peripancreatic edema. Spleen: No splenomegaly. No suspicious focal mass lesion. Adrenals/Urinary Tract: No adrenal nodule or mass. Tiny well-defined homogeneous low-density lesions in both kidneys are too small to characterize but are statistically most likely benign and probably cysts. No followup imaging is recommended. No evidence for hydroureter. The  urinary bladder appears normal for the degree of distention. Stomach/Bowel: Stomach is unremarkable. No gastric wall thickening. No evidence of outlet obstruction. Duodenum is normally positioned as is the ligament of Treitz. Diffuse distention of small bowel identified. These distended fluid-filled small bowel loops measure up to about 2.8 cm maximum diameter. Small bowel distension extends into the pelvis where there is a abnormal segment of small bowel demonstrating luminal narrowing and circumferential hyperemia wall thickening creating a relatively abrupt transition zone (see axial 65/2 and well demonstrated on coronal 50/6). Small bowel distal to this segment is fluid-filled and distended as well. Scattered areas of interloop mesenteric fluid are evident. Terminal ileum unremarkable. The appendix is normal. No gross colonic mass. No colonic wall thickening. Diverticuli are seen scattered along the entire length of the colon without CT findings of diverticulitis. Vascular/Lymphatic: No abdominal aortic aneurysm. No abdominal aortic atherosclerotic calcification. There is no gastrohepatic or hepatoduodenal ligament lymphadenopathy. No retroperitoneal or mesenteric lymphadenopathy. No pelvic sidewall lymphadenopathy. Reproductive: Unremarkable. Other: Trace free fluid is seen in the cul-de-sac. Musculoskeletal: No worrisome lytic or sclerotic osseous abnormality. IMPRESSION: 1. Diffuse distention of small bowel with scattered areas of interloop mesenteric fluid. Abnormal segment of small bowel in the pelvis demonstrates luminal narrowing and circumferential hyperemia wall thickening creating a relatively abrupt transition zone. Small bowel distal to this abnormal segment is fluid-filled and distended although to a potentially lesser degree. Changes of focal infectious/inflammatory enteritis could have this appearance, likely with underlying component of inflammatory stricture. 2. Mild intrahepatic and  extrahepatic biliary duct prominence, stable since prior. This is likely related to prior cholecystectomy. Correlation with liver function test recommended. Electronically Signed   By: Camellia Candle M.D.   On: 03/02/2024 07:50    EKG: Independently reviewed.  Sinus rhythm, no acute ST changes.  Assessment/Plan Principal Problem:   Enteritis  (please populate well all problems here in Problem List. (For example, if patient is on BP meds at home and you resume or decide to hold them, it is a problem that needs to be her. Same for CAD, COPD, HLD and so on)  Acute enteritis Intractable nausea vomiting and abdominal pain - Differential including viral/bacterial enteritis versus inflammation. D/W GI regarding the finding, GI's impression was this finding is more compatible with acute inflammation but unlikely IBD. - Revealed patient past medical show patient has had multiple similar episodes since 2022, was worked up extensively in 2023 including EGD and capsule endoscopy.  Significantly, in December 2023, capsule endoscopy found scattered mucosal ulcerations/erythema in the middle and distal third of small bowel, the changes was considered to be related to chronic NSAIDs use and patient was instructed to stop NSAIDs.  Currently, patient reports that she is very aware of the instructions from 2023 and she has been staying away from NSAIDs and has been taking only Tylenol  for  her joint pain. - Currently except leukocytosis there is no other systemic inflammation sign, we will hold off antibiotics. - Start probiotics - As for possible inflammatory bowel disease, recommend patient follow-up with GI.  Revealed her past showing that the patient had a borderline positive fecal calprotectin in 2023. - Continue IV fluid, Dilaudid  for pain and Zofran  for nauseous vomiting.  Leukocytosis - No other SIRS signs - Management as discussed above  Rheumatoid arthritis/SLE - Weekly methotrexate  DVT prophylaxis:  Lovenox  Code Status: Full code Family Communication: Daughter at bedside Disposition Plan: Expect less than 2 midnight hospital stay Consults called: None Admission status: MedSurg observation   Cort ONEIDA Mana MD [facility] Pager [phone].  03/02/2024, 10:44 AM

## 2024-03-02 NOTE — ED Triage Notes (Signed)
 Pt arrives POV reporting upper abd pain that radiates to back w/ nausea that started yesterday. Pt actively vomiting in triage.

## 2024-03-02 NOTE — ED Notes (Signed)
 Pt to CT

## 2024-03-03 DIAGNOSIS — K567 Ileus, unspecified: Secondary | ICD-10-CM

## 2024-03-03 DIAGNOSIS — R1032 Left lower quadrant pain: Secondary | ICD-10-CM

## 2024-03-03 LAB — BASIC METABOLIC PANEL WITH GFR
Anion gap: 6 (ref 5–15)
BUN: 13 mg/dL (ref 8–23)
CO2: 25 mmol/L (ref 22–32)
Calcium: 8.3 mg/dL — ABNORMAL LOW (ref 8.9–10.3)
Chloride: 109 mmol/L (ref 98–111)
Creatinine, Ser: 0.76 mg/dL (ref 0.44–1.00)
GFR, Estimated: >60 mL/min (ref >60)
Glucose, Bld: 96 mg/dL (ref 70–99)
Potassium: 4.2 mmol/L (ref 3.5–5.1)
Sodium: 140 mmol/L (ref 135–145)

## 2024-03-03 LAB — CBC
HCT: 35.7 % — ABNORMAL LOW (ref 36.0–46.0)
Hemoglobin: 11.1 g/dL — ABNORMAL LOW (ref 12.0–15.0)
MCH: 28.2 pg (ref 26.0–34.0)
MCHC: 31.1 g/dL (ref 30.0–36.0)
MCV: 90.8 fL (ref 80.0–100.0)
Platelets: 298 K/uL (ref 150–400)
RBC: 3.93 MIL/uL (ref 3.87–5.11)
RDW: 16.6 % — ABNORMAL HIGH (ref 11.5–15.5)
WBC: 4.2 K/uL (ref 4.0–10.5)
nRBC: 0 % (ref 0.0–0.2)

## 2024-03-03 LAB — HIV ANTIBODY (ROUTINE TESTING W REFLEX): HIV Screen 4th Generation wRfx: NONREACTIVE

## 2024-03-03 MED ORDER — SENNOSIDES-DOCUSATE SODIUM 8.6-50 MG PO TABS: 1.0000 | ORAL_TABLET | Freq: Every day | ORAL | 0 refills | Status: AC

## 2024-03-03 MED ORDER — SACCHAROMYCES BOULARDII 250 MG PO CAPS: 250.0000 mg | ORAL_CAPSULE | Freq: Two times a day (BID) | ORAL | 0 refills | Status: AC

## 2024-03-03 MED ORDER — SODIUM CHLORIDE 0.9 % IV SOLN: INTRAVENOUS | Status: DC

## 2024-03-03 NOTE — Plan of Care (Signed)

## 2024-03-03 NOTE — Progress Notes (Signed)
 [facility] SURGICAL PROGRESS NOTE (cpt [id number])  Hospital Day(s): 0.   Post op day(s):  SABRA   Interval History: Patient seen and examined, no acute events or new complaints overnight. Patient reports much improvement, denies nausea/vomiting, reports flatus and bowel activity.  She has tolerated clear liquids..  Review of Systems:  Constitutional: denies fever, chills  HEENT: denies cough or congestion  Respiratory: denies any shortness of breath  Cardiovascular: denies chest pain or palpitations  Gastrointestinal: denies abdominal pain, N/V, or diarrhea/and bowel function as per interval history Genitourinary: denies burning with urination or urinary frequency Musculoskeletal: denies pain, decreased motor or sensation Integumentary: denies any other rashes or skin discolorations Neurological: denies HA or vision/hearing changes   Vital signs in last 24 hours: [min-max] current  Temp:  [98.1 F (36.7 C)-98.3 F (36.8 C)] 98.1 F (36.7 C) (09/07 0409) Pulse Rate:  [66-80] 69 (09/07 0409) Resp:  [15-17] 16 (09/07 0409) BP: (119-142)/(56-72) 119/56 (09/07 0409) SpO2:  [95 %-98 %] 95 % (09/07 0409) Weight:  [83.2 kg] 83.2 kg (09/07 0500)     Height: 5' 5 (165.1 cm) Weight: 83.2 kg BMI (Calculated): 30.52   Intake/Output last 2 shifts:  09/06 0701 - 09/07 0700 In: 1954 [P.O.:480; I.V.:1474] Out: -    Physical Exam:  Constitutional: alert, cooperative and no distress  HENT: normocephalic without obvious abnormality  Eyes: PERRL, EOM's grossly intact and symmetric  Neuro: CN II - XII grossly intact and symmetric without deficit  Respiratory: breathing non-labored at rest  Cardiovascular: regular rate and sinus rhythm  Gastrointestinal: soft, non-tender, and non-distended Musculoskeletal: UE and LE FROM, no edema or wounds, motor and sensation grossly intact, NT    Labs:     Latest Ref Rng & Units 03/03/2024    5:30 AM 03/02/2024    4:51 AM 08/04/2020    5:48 PM   CBC  WBC 4.0 - 10.5 K/uL 4.2  13.4  16.8   Hemoglobin 12.0 - 15.0 g/dL 88.8  86.2  89.5   Hematocrit 36.0 - 46.0 % 35.7  42.9  33.1   Platelets 150 - 400 K/uL 298  391  369       Latest Ref Rng & Units 03/03/2024    5:30 AM 03/02/2024    4:51 AM 08/04/2020    5:48 PM  CMP  Glucose 70 - 99 mg/dL 96  866  869   BUN 8 - 23 mg/dL 13  15  18    Creatinine 0.44 - 1.00 mg/dL 9.23  9.04  9.04   Sodium 135 - 145 mmol/L 140  135  139   Potassium 3.5 - 5.1 mmol/L 4.2  3.8  3.7   Chloride 98 - 111 mmol/L 109  101  105   CO2 22 - 32 mmol/L 25  24  23    Calcium 8.9 - 10.3 mg/dL 8.3  9.9  9.5   Total Protein 6.5 - 8.1 g/dL  8.0  7.7   Total Bilirubin 0.0 - 1.2 mg/dL  1.0  0.6   Alkaline Phos 38 - 126 U/L  155  98   AST 15 - 41 U/L  54  32   ALT 0 - 44 U/L  22  16      Imaging studies: No new pertinent imaging studies   Assessment/Plan:  63 y.o. female with enteritis/inflammatory appearance of a section of small bowel likely the source of the patient's current symptoms, minimal surgical history to suggest adhesive disease.   Patient  Active Problem List   Diagnosis Date Noted   Enteritis 03/02/2024   Left lower quadrant abdominal pain 03/02/2024   Small bowel obstruction (HCC) 07/06/2018   SBO (small bowel obstruction) (HCC) 03/25/2018     - She has significantly proved, and I believe she will be ready for discharge today.    All of the above findings and recommendations were discussed with the patient and  family(if present), and all of patient's and present family's questions were answered to their expressed satisfaction.      I personally spent a total of 30 minutes in the care of the patient today including preparing to see the patient, performing a medically appropriate exam/evaluation, and documenting clinical information in the EHR.  -- Honor Leghorn M.D., [facility] 03/03/2024 11:14 AM

## 2024-03-03 NOTE — Discharge Summary (Signed)
 Physician Discharge Summary   Patient: Nicole Walsh MRN: [id number] DOB: 1960/09/26  Admit date:     03/02/2024  Discharge date: 03/03/24  Discharge Physician: Nicole Walsh   PCP: Nicole Walsh   Recommendations at discharge:   follow-up G.I. Dr. Maryruth in 1 to 2 week for IBD w/u PCP in 1 to 2 weeks patient and family advised to call [email or web address] and get prescription for methotrexate refilled  Discharge Diagnoses: SBO   Nicole Walsh is a 63 y.o. female with medical history significant of rheumatoid arthritis on methotrexate, presented with new onset of abdominal pain nauseous vomiting.   Symptoms started 2 days ago, patient started to have initially was a sharp like right-sided abdominal pain then expanded to left side and became constant, and yesterday the pain turned to cramping and patient started to feel nauseous and had 4-5 times of vomiting of stomach content.  CT abdomen pelvis showed diffuse distention of small bowel with scattered areas of interloop mesenteric fluid abnormal segment of the small bowel and pelvis demonstrated luminal narrowing and circumferential hyperemia or thickening creed relative abrupt transition from, changes of focal infection/inflammatory enteritis are possible.    SBO/Ileus??enteritis --improved with conservative management -- patient received IV fluids, PRN pain meds -- seen by general surgery. Okay to discharge since patient is able to tolerate regular diet passing gas. -- Patient had regular bowel movement on Friday. -- She had colonoscopy in 2023 which was an unremarkable. -- Patient is advised to follow-up with [facility] Dr. Maryruth if she continues to have symptoms on and off  History of rheumatoid arthritis -- patient advised to call [facility] rheumatology and get her meds refilled  Leukocytosis -- resolved  Discussed with patient and her family via Spanish interpreter discharge plan discussed. Questions  answered.      Consultants: general surgery Procedures performed: none Disposition: Home Diet recommendation:  Discharge Diet Orders (From admission, onward)     Start     Ordered   03/03/24 0000  Diet - low sodium heart healthy        03/03/24 1501           Regular diet DISCHARGE MEDICATION: Allergies as of 03/03/2024   No Known Allergies      Medication List     TAKE these medications    folic acid 1 MG tablet Commonly known as: FOLVITE Take 1 mg by mouth daily.   methotrexate 2.5 MG tablet Commonly known as: RHEUMATREX Take 15 mg by mouth once a week.   saccharomyces boulardii 250 MG capsule Commonly known as: FLORASTOR Take 1 capsule (250 mg total) by mouth 2 (two) times daily for 7 days.   senna-docusate 8.6-50 MG tablet Commonly known as: Senokot-S Take 1 tablet by mouth daily.        Follow-up Information     [facility], [place]. Schedule an appointment as soon as possible for a visit in 1 week(s).   Contact information: [address] [place] [address] [phone]         Nicole Ole DASEN, MD. Schedule an appointment as soon as possible for a visit in 1 week(s).   Specialty: Gastroenterology Why: for SBO f/u Contact information: [address] [place] [address] [phone]         Nicole Shope, MD. Go to.   Specialty: General Surgery Why: If symptoms worsen, As needed for SBO Contact information: [address] [place] [address] [phone]  Discharge Exam: Filed Weights   03/02/24 1048 03/03/24 0500  Weight: 78.9 kg 83.2 kg  patient alert and oriented times three Abdominal soft benign nontender. Respiratory clear to auscultation cardiovascular both heart sounds normal no murmur  Condition at discharge: fair  The results of significant diagnostics from this hospitalization (including imaging, microbiology, ancillary and laboratory) are listed below  for reference.   Imaging Studies: CT ABDOMEN PELVIS W CONTRAST Result Date: 03/02/2024 CLINICAL DATA:  Abdominal pain and nausea.  Vomiting. EXAM: CT ABDOMEN AND PELVIS WITH CONTRAST TECHNIQUE: Multidetector CT imaging of the abdomen and pelvis was performed using the standard protocol following bolus administration of intravenous contrast. RADIATION DOSE REDUCTION: This exam was performed according to the departmental dose-optimization program which includes automated exposure control, adjustment of the mA and/or kV according to patient size and/or use of iterative reconstruction technique. CONTRAST:  OMNIPAQUE  IOHEXOL  300 MG/ML  SOLN COMPARISON:  08/26/2022 FINDINGS: Lower chest: Unremarkable. Hepatobiliary: No suspicious focal abnormality within the liver parenchyma. Mild intrahepatic biliary duct prominence evident. Gallbladder is surgically absent. Common duct distended up to 16 mm in the hepatoduodenal ligament, stable since prior. Pancreas: No focal mass lesion. No dilatation of the main duct. No intraparenchymal cyst. No peripancreatic edema. Spleen: No splenomegaly. No suspicious focal mass lesion. Adrenals/Urinary Tract: No adrenal nodule or mass. Tiny well-defined homogeneous low-density lesions in both kidneys are too small to characterize but are statistically most likely benign and probably cysts. No followup imaging is recommended. No evidence for hydroureter. The urinary bladder appears normal for the degree of distention. Stomach/Bowel: Stomach is unremarkable. No gastric wall thickening. No evidence of outlet obstruction. Duodenum is normally positioned as is the ligament of Treitz. Diffuse distention of small bowel identified. These distended fluid-filled small bowel loops measure up to about 2.8 cm maximum diameter. Small bowel distension extends into the pelvis where there is a abnormal segment of small bowel demonstrating luminal narrowing and circumferential hyperemia wall thickening  creating a relatively abrupt transition zone (see axial 65/2 and well demonstrated on coronal 50/6). Small bowel distal to this segment is fluid-filled and distended as well. Scattered areas of interloop mesenteric fluid are evident. Terminal ileum unremarkable. The appendix is normal. No gross colonic mass. No colonic wall thickening. Diverticuli are seen scattered along the entire length of the colon without CT findings of diverticulitis. Vascular/Lymphatic: No abdominal aortic aneurysm. No abdominal aortic atherosclerotic calcification. There is no gastrohepatic or hepatoduodenal ligament lymphadenopathy. No retroperitoneal or mesenteric lymphadenopathy. No pelvic sidewall lymphadenopathy. Reproductive: Unremarkable. Other: Trace free fluid is seen in the cul-de-sac. Musculoskeletal: No worrisome lytic or sclerotic osseous abnormality. IMPRESSION: 1. Diffuse distention of small bowel with scattered areas of interloop mesenteric fluid. Abnormal segment of small bowel in the pelvis demonstrates luminal narrowing and circumferential hyperemia wall thickening creating a relatively abrupt transition zone. Small bowel distal to this abnormal segment is fluid-filled and distended although to a potentially lesser degree. Changes of focal infectious/inflammatory enteritis could have this appearance, likely with underlying component of inflammatory stricture. 2. Mild intrahepatic and extrahepatic biliary duct prominence, stable since prior. This is likely related to prior cholecystectomy. Correlation with liver function test recommended. Electronically Signed   By: Camellia Candle M.D.   On: 03/02/2024 07:50    Microbiology: Results for orders placed or performed during the hospital encounter of 08/04/20  Resp Panel by RT-PCR (Flu A&B, Covid) Nasopharyngeal Swab     Status: None   Collection Time: 08/04/20  6:43 PM   Specimen: Nasopharyngeal  Swab; Nasopharyngeal(NP) swabs in vial transport medium  Result Value Ref  Range Status   SARS Coronavirus 2 by RT PCR NEGATIVE NEGATIVE Final    Comment: (NOTE) SARS-CoV-2 target nucleic acids are NOT DETECTED.  The SARS-CoV-2 RNA is generally detectable in upper respiratory specimens during the acute phase of infection. The lowest concentration of SARS-CoV-2 viral copies this assay can detect is 138 copies/mL. A negative result does not preclude SARS-Cov-2 infection and should not be used as the sole basis for treatment or other patient management decisions. A negative result may occur with  improper specimen collection/handling, submission of specimen other than nasopharyngeal swab, presence of viral mutation(s) within the areas targeted by this assay, and inadequate number of viral copies(<138 copies/mL). A negative result must be combined with clinical observations, patient history, and epidemiological information. The expected result is Negative.  Fact Sheet for Patients:  BloggerCourse.com  Fact Sheet for Healthcare Providers:  SeriousBroker.it  This test is no t yet approved or cleared by the [place]  FDA and  has been authorized for detection and/or diagnosis of SARS-CoV-2 by FDA under an Emergency Use Authorization (EUA). This EUA will remain  in effect (meaning this test can be used) for the duration of the COVID-19 declaration under Section 564(b)(1) of the Act, 21 U.S.C.section 360bbb-3(b)(1), unless the authorization is terminated  or revoked sooner.       Influenza A by PCR NEGATIVE NEGATIVE Final   Influenza B by PCR NEGATIVE NEGATIVE Final    Comment: (NOTE) The Xpert Xpress SARS-CoV-2/FLU/RSV plus assay is intended as an aid in the diagnosis of influenza from Nasopharyngeal swab specimens and should not be used as a sole basis for treatment. Nasal washings and aspirates are unacceptable for Xpert Xpress SARS-CoV-2/FLU/RSV testing.  Fact Sheet for  Patients: BloggerCourse.com  Fact Sheet for Healthcare Providers: SeriousBroker.it  This test is not yet approved or cleared by the [place]  FDA and has been authorized for detection and/or diagnosis of SARS-CoV-2 by FDA under an Emergency Use Authorization (EUA). This EUA will remain in effect (meaning this test can be used) for the duration of the COVID-19 declaration under Section 564(b)(1) of the Act, 21 U.S.C. section 360bbb-3(b)(1), unless the authorization is terminated or revoked.  Performed at [facility], [address] [place], [place] [address]     Labs: CBC: Recent Labs  Lab 03/02/24 0451 03/03/24 0530  WBC 13.4* 4.2  HGB 13.7 11.1*  HCT 42.9 35.7*  MCV 89.0 90.8  PLT 391 298   Basic Metabolic Panel: Recent Labs  Lab 03/02/24 0451 03/03/24 0530  NA 135 140  K 3.8 4.2  CL 101 109  CO2 24 25  GLUCOSE 133* 96  BUN 15 13  CREATININE 0.95 0.76  CALCIUM 9.9 8.3*   Liver Function Tests: Recent Labs  Lab 03/02/24 0451  AST 54*  ALT 22  ALKPHOS 155*  BILITOT 1.0  PROT 8.0  ALBUMIN 4.7    Discharge time spent: greater than 30 minutes.  Signed: Leita Blanch, MD [facility] 03/03/2024

## 2024-03-03 NOTE — Discharge Instructions (Signed)
 Take stool softner daily Increased fiber in your diet

## 2024-03-03 NOTE — Plan of Care (Signed)
